# Patient Record
Sex: Female | Born: 1965
Health system: Southern US, Community
[De-identification: ages and names within clinical notes are randomized; demographics above are authoritative.]

## PROBLEM LIST (undated history)

## (undated) DIAGNOSIS — I10 Essential (primary) hypertension: Secondary | ICD-10-CM

## (undated) DIAGNOSIS — Z9109 Other allergy status, other than to drugs and biological substances: Secondary | ICD-10-CM

## (undated) DIAGNOSIS — N814 Uterovaginal prolapse, unspecified: Secondary | ICD-10-CM

## (undated) HISTORY — DX: Other allergy status, other than to drugs and biological substances: Z91.09

## (undated) HISTORY — PX: COLONOSCOPY: SHX174

---

## 1998-10-28 ENCOUNTER — Encounter: Admission: RE | Admit: 1998-10-28 | Discharge: 1998-10-28 | Payer: Self-pay | Admitting: Sports Medicine

## 1999-06-25 ENCOUNTER — Emergency Department (HOSPITAL_COMMUNITY): Admission: EM | Admit: 1999-06-25 | Discharge: 1999-06-25 | Payer: Self-pay | Admitting: Emergency Medicine

## 1999-07-26 ENCOUNTER — Encounter: Admission: RE | Admit: 1999-07-26 | Discharge: 1999-07-26 | Payer: Self-pay | Admitting: Family Medicine

## 1999-11-10 ENCOUNTER — Emergency Department (HOSPITAL_COMMUNITY): Admission: EM | Admit: 1999-11-10 | Discharge: 1999-11-10 | Payer: Self-pay | Admitting: Internal Medicine

## 1999-11-12 ENCOUNTER — Encounter: Admission: RE | Admit: 1999-11-12 | Discharge: 1999-11-12 | Payer: Self-pay | Admitting: Family Medicine

## 2000-06-22 ENCOUNTER — Encounter: Admission: RE | Admit: 2000-06-22 | Discharge: 2000-06-22 | Payer: Self-pay | Admitting: Family Medicine

## 2000-12-06 ENCOUNTER — Encounter: Admission: RE | Admit: 2000-12-06 | Discharge: 2000-12-06 | Payer: Self-pay | Admitting: Family Medicine

## 2001-06-30 ENCOUNTER — Encounter (INDEPENDENT_AMBULATORY_CARE_PROVIDER_SITE_OTHER): Payer: Self-pay | Admitting: *Deleted

## 2001-07-10 ENCOUNTER — Encounter: Admission: RE | Admit: 2001-07-10 | Discharge: 2001-07-10 | Payer: Self-pay | Admitting: Family Medicine

## 2002-12-02 ENCOUNTER — Encounter: Admission: RE | Admit: 2002-12-02 | Discharge: 2002-12-02 | Payer: Self-pay | Admitting: Family Medicine

## 2002-12-30 ENCOUNTER — Encounter: Admission: RE | Admit: 2002-12-30 | Discharge: 2002-12-30 | Payer: Self-pay | Admitting: Sports Medicine

## 2003-01-13 ENCOUNTER — Ambulatory Visit (HOSPITAL_COMMUNITY): Admission: RE | Admit: 2003-01-13 | Discharge: 2003-01-13 | Payer: Self-pay | Admitting: Family Medicine

## 2003-01-20 ENCOUNTER — Encounter: Payer: Self-pay | Admitting: *Deleted

## 2003-01-20 ENCOUNTER — Ambulatory Visit (HOSPITAL_COMMUNITY): Admission: RE | Admit: 2003-01-20 | Discharge: 2003-01-20 | Payer: Self-pay | Admitting: *Deleted

## 2003-02-03 ENCOUNTER — Encounter: Admission: RE | Admit: 2003-02-03 | Discharge: 2003-02-03 | Payer: Self-pay | Admitting: Family Medicine

## 2003-03-03 ENCOUNTER — Encounter: Admission: RE | Admit: 2003-03-03 | Discharge: 2003-03-03 | Payer: Self-pay | Admitting: Family Medicine

## 2003-04-07 ENCOUNTER — Encounter: Admission: RE | Admit: 2003-04-07 | Discharge: 2003-04-07 | Payer: Self-pay | Admitting: Family Medicine

## 2003-04-28 ENCOUNTER — Encounter: Admission: RE | Admit: 2003-04-28 | Discharge: 2003-04-28 | Payer: Self-pay | Admitting: Family Medicine

## 2003-05-15 ENCOUNTER — Encounter: Admission: RE | Admit: 2003-05-15 | Discharge: 2003-05-15 | Payer: Self-pay | Admitting: Family Medicine

## 2003-05-27 ENCOUNTER — Encounter: Admission: RE | Admit: 2003-05-27 | Discharge: 2003-05-27 | Payer: Self-pay | Admitting: Sports Medicine

## 2003-06-03 ENCOUNTER — Encounter: Admission: RE | Admit: 2003-06-03 | Discharge: 2003-06-03 | Payer: Self-pay | Admitting: Family Medicine

## 2003-06-10 ENCOUNTER — Encounter: Admission: RE | Admit: 2003-06-10 | Discharge: 2003-06-10 | Payer: Self-pay | Admitting: Family Medicine

## 2003-06-12 ENCOUNTER — Ambulatory Visit (HOSPITAL_COMMUNITY): Admission: RE | Admit: 2003-06-12 | Discharge: 2003-06-12 | Payer: Self-pay

## 2003-06-14 ENCOUNTER — Inpatient Hospital Stay (HOSPITAL_COMMUNITY): Admission: AD | Admit: 2003-06-14 | Discharge: 2003-06-16 | Payer: Self-pay | Admitting: Obstetrics and Gynecology

## 2003-06-14 ENCOUNTER — Encounter (INDEPENDENT_AMBULATORY_CARE_PROVIDER_SITE_OTHER): Payer: Self-pay | Admitting: Specialist

## 2003-08-15 ENCOUNTER — Encounter: Admission: RE | Admit: 2003-08-15 | Discharge: 2003-08-15 | Payer: Self-pay | Admitting: Sports Medicine

## 2004-08-09 ENCOUNTER — Ambulatory Visit: Payer: Self-pay | Admitting: Family Medicine

## 2005-04-08 ENCOUNTER — Ambulatory Visit: Payer: Self-pay | Admitting: Family Medicine

## 2005-07-31 ENCOUNTER — Emergency Department (HOSPITAL_COMMUNITY): Admission: AD | Admit: 2005-07-31 | Discharge: 2005-07-31 | Payer: Self-pay | Admitting: Family Medicine

## 2005-08-12 ENCOUNTER — Encounter (INDEPENDENT_AMBULATORY_CARE_PROVIDER_SITE_OTHER): Payer: Self-pay | Admitting: *Deleted

## 2005-08-12 ENCOUNTER — Ambulatory Visit: Payer: Self-pay | Admitting: Family Medicine

## 2005-12-07 ENCOUNTER — Emergency Department (HOSPITAL_COMMUNITY): Admission: EM | Admit: 2005-12-07 | Discharge: 2005-12-07 | Payer: Self-pay | Admitting: Emergency Medicine

## 2006-01-11 ENCOUNTER — Ambulatory Visit: Payer: Self-pay | Admitting: Sports Medicine

## 2006-01-13 ENCOUNTER — Ambulatory Visit: Payer: Self-pay | Admitting: Family Medicine

## 2006-02-21 ENCOUNTER — Ambulatory Visit: Payer: Self-pay | Admitting: Family Medicine

## 2006-06-29 DIAGNOSIS — J309 Allergic rhinitis, unspecified: Secondary | ICD-10-CM | POA: Insufficient documentation

## 2006-06-29 DIAGNOSIS — B351 Tinea unguium: Secondary | ICD-10-CM

## 2006-06-30 ENCOUNTER — Encounter (INDEPENDENT_AMBULATORY_CARE_PROVIDER_SITE_OTHER): Payer: Self-pay | Admitting: *Deleted

## 2006-09-18 ENCOUNTER — Ambulatory Visit: Payer: Self-pay | Admitting: Family Medicine

## 2006-09-18 ENCOUNTER — Encounter (INDEPENDENT_AMBULATORY_CARE_PROVIDER_SITE_OTHER): Payer: Self-pay | Admitting: Family Medicine

## 2006-09-18 DIAGNOSIS — I10 Essential (primary) hypertension: Secondary | ICD-10-CM | POA: Insufficient documentation

## 2006-09-18 LAB — CONVERTED CEMR LAB
CO2: 25 meq/L (ref 19–32)
Cholesterol: 98 mg/dL (ref 0–200)
Creatinine, Ser: 0.6 mg/dL (ref 0.40–1.20)
Glucose, Bld: 93 mg/dL (ref 70–99)
LDL Cholesterol: 40 mg/dL (ref 0–99)
Potassium: 4.4 meq/L (ref 3.5–5.3)
Total CHOL/HDL Ratio: 2.2

## 2006-09-19 ENCOUNTER — Encounter (INDEPENDENT_AMBULATORY_CARE_PROVIDER_SITE_OTHER): Payer: Self-pay | Admitting: Family Medicine

## 2006-10-04 ENCOUNTER — Emergency Department (HOSPITAL_COMMUNITY): Admission: EM | Admit: 2006-10-04 | Discharge: 2006-10-04 | Payer: Self-pay | Admitting: Emergency Medicine

## 2006-10-13 ENCOUNTER — Encounter: Admission: RE | Admit: 2006-10-13 | Discharge: 2006-10-13 | Payer: Self-pay | Admitting: Family Medicine

## 2006-10-13 ENCOUNTER — Encounter (INDEPENDENT_AMBULATORY_CARE_PROVIDER_SITE_OTHER): Payer: Self-pay | Admitting: Family Medicine

## 2006-11-02 ENCOUNTER — Ambulatory Visit: Payer: Self-pay | Admitting: Family Medicine

## 2006-11-02 DIAGNOSIS — R079 Chest pain, unspecified: Secondary | ICD-10-CM

## 2006-11-30 ENCOUNTER — Ambulatory Visit: Payer: Self-pay | Admitting: Family Medicine

## 2006-11-30 ENCOUNTER — Encounter: Admission: RE | Admit: 2006-11-30 | Discharge: 2006-11-30 | Payer: Self-pay | Admitting: Sports Medicine

## 2006-11-30 DIAGNOSIS — M542 Cervicalgia: Secondary | ICD-10-CM

## 2007-03-07 ENCOUNTER — Ambulatory Visit: Payer: Self-pay | Admitting: Family Medicine

## 2007-03-26 ENCOUNTER — Emergency Department (HOSPITAL_COMMUNITY): Admission: EM | Admit: 2007-03-26 | Discharge: 2007-03-26 | Payer: Self-pay | Admitting: Family Medicine

## 2007-05-07 ENCOUNTER — Ambulatory Visit: Payer: Self-pay | Admitting: Family Medicine

## 2007-05-07 DIAGNOSIS — R519 Headache, unspecified: Secondary | ICD-10-CM | POA: Insufficient documentation

## 2007-05-07 DIAGNOSIS — R1013 Epigastric pain: Secondary | ICD-10-CM | POA: Insufficient documentation

## 2007-05-07 DIAGNOSIS — M549 Dorsalgia, unspecified: Secondary | ICD-10-CM | POA: Insufficient documentation

## 2007-05-07 DIAGNOSIS — R51 Headache: Secondary | ICD-10-CM

## 2007-05-07 LAB — CONVERTED CEMR LAB: H Pylori IgG: POSITIVE

## 2007-09-23 ENCOUNTER — Emergency Department (HOSPITAL_COMMUNITY): Admission: EM | Admit: 2007-09-23 | Discharge: 2007-09-23 | Payer: Self-pay | Admitting: Family Medicine

## 2007-10-08 ENCOUNTER — Ambulatory Visit: Payer: Self-pay | Admitting: Family Medicine

## 2007-10-08 DIAGNOSIS — H538 Other visual disturbances: Secondary | ICD-10-CM

## 2007-10-22 ENCOUNTER — Ambulatory Visit (HOSPITAL_COMMUNITY): Admission: RE | Admit: 2007-10-22 | Discharge: 2007-10-22 | Payer: Self-pay | Admitting: Pediatrics

## 2007-11-16 ENCOUNTER — Encounter: Payer: Self-pay | Admitting: Family Medicine

## 2007-11-16 ENCOUNTER — Encounter (INDEPENDENT_AMBULATORY_CARE_PROVIDER_SITE_OTHER): Payer: Self-pay | Admitting: Family Medicine

## 2007-11-16 ENCOUNTER — Ambulatory Visit: Payer: Self-pay | Admitting: Family Medicine

## 2007-11-16 LAB — CONVERTED CEMR LAB
BUN: 13 mg/dL (ref 6–23)
CO2: 23 meq/L (ref 19–32)
HCT: 40.8 % (ref 36.0–46.0)
Hemoglobin: 13.2 g/dL (ref 12.0–15.0)
MCHC: 32.4 g/dL (ref 30.0–36.0)
MCV: 88.7 fL (ref 78.0–100.0)
Potassium: 3.8 meq/L (ref 3.5–5.3)
RDW: 14.2 % (ref 11.5–15.5)
Total CHOL/HDL Ratio: 2.8
Triglycerides: 80 mg/dL (ref <150)
WBC: 6.1 10*3/uL (ref 4.0–10.5)

## 2007-11-20 ENCOUNTER — Encounter: Payer: Self-pay | Admitting: Family Medicine

## 2008-02-15 ENCOUNTER — Encounter: Payer: Self-pay | Admitting: *Deleted

## 2008-02-15 ENCOUNTER — Ambulatory Visit: Payer: Self-pay | Admitting: Family Medicine

## 2008-06-05 ENCOUNTER — Telehealth: Payer: Self-pay | Admitting: Family Medicine

## 2008-06-06 ENCOUNTER — Ambulatory Visit: Payer: Self-pay | Admitting: Family Medicine

## 2008-08-15 ENCOUNTER — Telehealth: Payer: Self-pay | Admitting: Family Medicine

## 2008-08-15 ENCOUNTER — Emergency Department (HOSPITAL_COMMUNITY): Admission: EM | Admit: 2008-08-15 | Discharge: 2008-08-15 | Payer: Self-pay | Admitting: Emergency Medicine

## 2008-10-07 ENCOUNTER — Ambulatory Visit: Payer: Self-pay | Admitting: Family Medicine

## 2008-11-10 ENCOUNTER — Encounter: Admission: RE | Admit: 2008-11-10 | Discharge: 2008-11-10 | Payer: Self-pay | Admitting: Family Medicine

## 2008-11-10 ENCOUNTER — Encounter: Payer: Self-pay | Admitting: Family Medicine

## 2008-11-13 ENCOUNTER — Encounter: Payer: Self-pay | Admitting: Family Medicine

## 2008-11-18 ENCOUNTER — Encounter (INDEPENDENT_AMBULATORY_CARE_PROVIDER_SITE_OTHER): Payer: Self-pay | Admitting: Family Medicine

## 2008-11-18 ENCOUNTER — Ambulatory Visit: Payer: Self-pay | Admitting: Family Medicine

## 2008-11-18 ENCOUNTER — Encounter: Payer: Self-pay | Admitting: Family Medicine

## 2008-11-18 LAB — CONVERTED CEMR LAB
CO2: 26 meq/L (ref 19–32)
Calcium: 9.2 mg/dL (ref 8.4–10.5)
Potassium: 4.4 meq/L (ref 3.5–5.3)

## 2008-11-19 ENCOUNTER — Encounter: Payer: Self-pay | Admitting: Family Medicine

## 2008-11-21 ENCOUNTER — Encounter: Payer: Self-pay | Admitting: Family Medicine

## 2008-12-15 ENCOUNTER — Telehealth: Payer: Self-pay | Admitting: Family Medicine

## 2008-12-16 ENCOUNTER — Ambulatory Visit: Payer: Self-pay | Admitting: Family Medicine

## 2008-12-16 DIAGNOSIS — Z780 Asymptomatic menopausal state: Secondary | ICD-10-CM | POA: Insufficient documentation

## 2009-01-06 ENCOUNTER — Ambulatory Visit: Payer: Self-pay | Admitting: Family Medicine

## 2009-01-06 ENCOUNTER — Encounter: Payer: Self-pay | Admitting: Family Medicine

## 2009-01-06 LAB — CONVERTED CEMR LAB
Hemoglobin: 12.3 g/dL (ref 12.0–15.0)
RDW: 14.1 % (ref 11.5–15.5)
TSH: 0.996 microintl units/mL (ref 0.350–4.500)
WBC: 5.2 10*3/uL (ref 4.0–10.5)

## 2009-01-07 ENCOUNTER — Encounter: Payer: Self-pay | Admitting: Family Medicine

## 2009-01-14 ENCOUNTER — Telehealth: Payer: Self-pay | Admitting: *Deleted

## 2009-05-04 ENCOUNTER — Ambulatory Visit: Payer: Self-pay | Admitting: Family Medicine

## 2009-05-15 ENCOUNTER — Ambulatory Visit: Payer: Self-pay | Admitting: Family Medicine

## 2009-05-15 ENCOUNTER — Encounter: Payer: Self-pay | Admitting: Family Medicine

## 2009-05-15 DIAGNOSIS — N926 Irregular menstruation, unspecified: Secondary | ICD-10-CM

## 2009-05-16 ENCOUNTER — Encounter: Payer: Self-pay | Admitting: Family Medicine

## 2009-05-16 LAB — CONVERTED CEMR LAB
AST: 15 units/L (ref 0–37)
BUN: 11 mg/dL (ref 6–23)
Creatinine, Ser: 1.02 mg/dL (ref 0.40–1.20)
Glucose, Bld: 102 mg/dL — ABNORMAL HIGH (ref 70–99)
HCT: 39 % (ref 36.0–46.0)
Hemoglobin: 12.9 g/dL (ref 12.0–15.0)
MCV: 86.3 fL (ref 78.0–100.0)
Platelets: 229 10*3/uL (ref 150–400)
Sodium: 139 meq/L (ref 135–145)
Total Bilirubin: 0.4 mg/dL (ref 0.3–1.2)
Total Protein: 7.4 g/dL (ref 6.0–8.3)
WBC: 5 10*3/uL (ref 4.0–10.5)

## 2009-05-18 ENCOUNTER — Encounter: Admission: RE | Admit: 2009-05-18 | Discharge: 2009-05-18 | Payer: Self-pay | Admitting: Family Medicine

## 2009-05-18 ENCOUNTER — Telehealth: Payer: Self-pay | Admitting: Family Medicine

## 2009-11-20 ENCOUNTER — Encounter: Admission: RE | Admit: 2009-11-20 | Discharge: 2009-11-20 | Payer: Self-pay | Admitting: Family Medicine

## 2009-11-20 ENCOUNTER — Ambulatory Visit: Payer: Self-pay | Admitting: Family Medicine

## 2009-11-20 ENCOUNTER — Encounter: Payer: Self-pay | Admitting: Family Medicine

## 2009-11-20 LAB — CONVERTED CEMR LAB
GC Probe Amp, Genital: NEGATIVE
Whiff Test: NEGATIVE

## 2009-11-22 ENCOUNTER — Encounter: Payer: Self-pay | Admitting: Family Medicine

## 2010-01-21 ENCOUNTER — Ambulatory Visit: Payer: Self-pay | Admitting: Family Medicine

## 2010-01-21 ENCOUNTER — Telehealth: Payer: Self-pay | Admitting: Family Medicine

## 2010-01-21 ENCOUNTER — Encounter: Payer: Self-pay | Admitting: Family Medicine

## 2010-01-21 DIAGNOSIS — K219 Gastro-esophageal reflux disease without esophagitis: Secondary | ICD-10-CM | POA: Insufficient documentation

## 2010-05-23 ENCOUNTER — Encounter: Payer: Self-pay | Admitting: Family Medicine

## 2010-06-03 NOTE — Letter (Signed)
Summary: Results Follow-up Letter  [facility]  [address]   [place], [place] [address]   Phone: [phone]  Fax: [phone]    11/22/2009  [address] [place], [place]  [address]  Dear Ms. Sheerin,   The following are the results of your recent test(s):  Your tests for gonorrhea, chlamydia, HIV, syphillis, and trichomonas are negative.  Please remember to get your yearly mammogram.  Nice to meet you!  Sincerely,  Delbert Harness MD [facility]           Appended Document: Results Follow-up Letter mailed

## 2010-06-03 NOTE — Assessment & Plan Note (Signed)
Summary: irregular periods continue/ls   Vital Signs:  Patient profile:   45 year old female Height:      66.25 inches Weight:      202.1 pounds BMI:     32.49 Temp:     98.5 degrees F Pulse rate:   83 / minute BP sitting:   132 / 82  (left arm) Cuff size:   regular  Vitals Entered By: Gladstone Pih (May 15, 2009 8:33 AM) CC: C/O irreg cycles Pain Assessment Patient in pain? no        CC:  C/O irreg cycles.  History of Present Illness: 1.  menses--still having irregular cycles.  Initially the problem was having no period July and August .  At that time, we discussed options to regulate her period.  She actually opted for a depo shot.  Depo shot in August.  After that she started having longer periods with heavy bleeding: long period with heavy bleeding (5-6 pad/day) and clots in September 10/15-11/6:  heavy 12/9-1/2; not as heavy  What worries her is that she used to have very regular periods.  She is worried that she might have something wrong, such as cancer or fibroids.    spent >50% of visit in face-to-face counseling with patient    Habits & Providers  Alcohol-Tobacco-Diet     Tobacco Status: never  Current Medications (verified): 1)  Flonase 50 Mcg/act Susp (Fluticasone Propionate) .... Spray 2 Spray Into Both Nostrils Once A Day 2)  Hydrochlorothiazide 25 Mg Tabs (Hydrochlorothiazide) .... Take 1 Tablet By Mouth Once A Day 3)  Zyrtec Allergy 10 Mg Tabs (Cetirizine Hcl) .... Take 1 Tablet By Mouth At Bedtime 4)  Ibuprofen 600 Mg  Tabs (Ibuprofen) .Marland Kitchen.. 1 Tablet Po Every 4-6 Hours As Needed Pain; Take With Food 5)  Fluconazole 200 Mg Tabs (Fluconazole) .Marland Kitchen.. 1 Tab By Mouth Weekly For Feet  Allergies: No Known Drug Allergies  Review of Systems       The patient complains of headaches.  The patient denies anorexia, weight loss, and weight gain.         also complains of fatigue  Physical Exam  General:  Well-developed,overweight,in no acute  distress; alert,appropriate and cooperative throughout examination Genitalia:  defered.  normal exam at her pap when we first discussed this issue Psych:  anxious about her periods Additional Exam:  vital signs reviewed    Impression & Recommendations:  Problem # 1:  IRREGULAR MENSES (ICD-626.4) Assessment Deteriorated I still think this is perimenopause.  However, pt is quite worried that there is something wrong.  she is hesitant to try OCPs before further testing.  I don't think that she will be satisfied until further testing is done.  recheck CBC.  Check CMET.  order transvag u/s.  follow up after u/s done.   Orders: CBC-[facility] ([address]) Comp Met-[facility] ([address]11) Ultrasound (Ultrasound) [facility]- Est  Level 4 ([address])  Complete Medication List: 1)  Flonase 50 Mcg/act Susp (Fluticasone propionate) .... Spray 2 spray into both nostrils once a day 2)  Hydrochlorothiazide 25 Mg Tabs (Hydrochlorothiazide) .... Take 1 tablet by mouth once a day 3)  Zyrtec Allergy 10 Mg Tabs (Cetirizine hcl) .... Take 1 tablet by mouth at bedtime 4)  Ibuprofen 600 Mg Tabs (Ibuprofen) .Marland Kitchen.. 1 tablet po every 4-6 hours as needed pain; take with food 5)  Fluconazole 200 Mg Tabs (Fluconazole) .Marland Kitchen.. 1 tab by mouth weekly for feet  Patient Instructions: 1)  It was nice to see  you today. 2)  We will schedule your ultrasound. 3)  I will call you if your labs are not normal.  Otherwise, I will send you a letter. 4)  Please schedule a follow-up appointment after your ultrasound. 5)  Call me if you have any problems in the meantime

## 2010-06-03 NOTE — Assessment & Plan Note (Signed)
Summary: flu shot,tcb  Nurse Visit   Vital Signs:  Patient profile:   45 year old female Temp:     98.9 degrees F  Vitals Entered By: Theresia Lo RN (May 04, 2009 3:56 PM)  Allergies: No Known Drug Allergies  Immunizations Administered:  Influenza Vaccine # 1:    Vaccine Type: Fluvax Non-MCR    Site: left deltoid    Mfr: GlaxoSmithKline    Dose: 0.5 ml    Route: IM    Given by: Theresia Lo RN    Exp. Date: 10/29/2009    Lot #: [id number]    VIS given: 12/09/2008  Flu Vaccine Consent Questions:    Do you have a history of severe allergic reactions to this vaccine? no    Any prior history of allergic reactions to egg and/or gelatin? no    Do you have a sensitivity to the preservative Thimersol? no    Do you have a past history of Guillan-Barre Syndrome? no    Do you currently have an acute febrile illness? no    Have you ever had a severe reaction to latex? no    Vaccine information given and explained to patient? yes    Are you currently pregnant? no  Orders Added: 1)  Influenza Vaccine NON MCR [00028] 2)  Admin 1st Vaccine [90471]   Orders Added: 1)  Influenza Vaccine NON MCR [00028] 2)  Admin 1st Vaccine [90471]      Vital Signs:  Patient profile:   45 year old female Temp:     98.9 degrees F  Vitals Entered By: Theresia Lo RN (May 04, 2009 3:56 PM)

## 2010-06-03 NOTE — Assessment & Plan Note (Signed)
Summary: HA x 1 wk/[place]   Vital Signs:  Patient profile:   45 year old female Weight:      202 pounds Temp:     98.8 degrees F oral Pulse rate:   87 / minute BP sitting:   122 / 87  (right arm)  Vitals Entered By: Arlyss Repress CMA, (January 21, 2010 1:57 PM) CC: HA, Abdominal Pain   Primary Care Provider:  Delbert Harness MD  CC:  HA and Abdominal Pain.  History of Present Illness: 45 y/o F with HTN presents with HA x 1 week.  Pt is prone to headache, but previous HAs has always resolved.  This time HA has not resolved with ibuprofen or tylenol.  Took advil x2 tabs, with some relief.  HEADACHE  Onset: 1 week Location: starts from left side of neck and goes up to whole head Description: squeezing Last time she had HA similar to this was 09/2008, which was relieved by ibuprofen 600mg   Symptoms Nausea/vomiting: nausea started last night and this morning  Photophobia: no Phonophobia: no Tearing of eyes: no Sinus pain/pressure: no. she has allergies and this does not feel like that.  She has not taken zyrtec  Relation to menstrual cycle: no  Red Flags Fever: yes "body always feels hot" Neck pain/stiffness: no Vision/speech difficulty: no Focal weakness or numbness: no Altered mental status: no Trauma: no Worse in am: no Anticoagulant use: no Immunocompromise: no Travel: no  Family history of Migraine: no   REFLUX: Pt has been having reflux for years.  She has been taking TUMS, which worked at first.  Now [employer] not effective.  endorses feeling "burning" sensation substernal.  NO chest pain. Pt eating very spicy foods.  no weight loss.  no bloody stoools.       Dyspepsia History:      There is a prior history of GERD.  She notes that it has been less than 12 months since the last episode of GERD and that she has never had an upper endoscopy or GI consultation.  The patient does not have a prior history of documented ulcer disease.  The dominant symptom is not  heartburn or acid reflux.  An H-2 blocker medication is not currently being taken.     Allergies: No Known Drug Allergies  Past History:  Past Medical History: Last updated: 11/18/2008 h/o positive ppd, no sxs (immnized?) allergic rhinitis nail fungus htn  Past Surgical History: Last updated: 11/16/2007 none  Family History: Last updated: 06/29/2006 B, S:  Healthy, F:  Unknown (died while pt was very young), M:  HTN (died while pt was very young), No know CAD, CA, hyperchol, asthma, DM  Social History: Last updated: 11/18/2008 Moved from [place] 17 years ago, married with 3 children, 2 years college, works at [employer], occasional  EtOH no tob, drugs. no regular exercise Never Smoked  Risk Factors: Smoking Status: never (05/15/2009)  Review of Systems       per hpi   Physical Exam  General:  Well-developed,well-nourished,in no acute distress; alert,appropriate and cooperative throughout examination. vitals reviewed.  Eyes:  No corneal or conjunctival inflammation noted. EOMI. Perrla. Funduscopic exam benign, without hemorrhages, exudates or papilledema. Vision grossly normal. Mouth:  Oral mucosa and oropharynx without lesions or exudates.  Neck:  supple, full ROM, no masses, no cervical lymphadenopathy, and no neck tenderness.   Lungs:  Normal respiratory effort, chest expands symmetrically. Lungs are clear to auscultation, no crackles or wheezes. Heart:  Normal rate  and regular rhythm. S1 and S2 normal without gallop, murmur, click, rub or other extra sounds. Neurologic:  No cranial nerve deficits noted. Station and gait are normal. Plantar reflexes are down-going bilaterally. DTRs are symmetrical throughout. Sensory, motor and coordinative functions appear intact.strength normal in all extremities, sensation intact to light touch, gait normal, DTRs symmetrical and normal, finger-to-nose normal, and heel-to-shin normal.     Impression & Recommendations:  Problem # 1:   HEADACHE (ICD-784.0) Assessment New HA etiology unclear.  2/2 allergies?  Pt also having symptoms of seasonal allergies (rhinorrhea, scratchy throat) but only taking flonase.  She has not been taking Zyrtec.   Advised resuming Zyrtec.  Will Rx ibuprofen 600mg  as it has worked in past.  RTC in 1 wk if not better.  No red flag symptoms.  Neuro exam wnl.   Her updated medication list for this problem includes:    Ibuprofen 600 Mg Tabs (Ibuprofen) .Marland Kitchen... 1 tablet po every 4-6 hours as needed pain; take with food  Orders: [facility]- Est  Level 4 ([address])  Problem # 2:  GERD (ICD-530.81) Assessment: Deteriorated GERD present for yrs but not refractory to TUMS.  Will try Omeprazole.  Discussed GERD lifestyle changes (avoid spicy foods, elevated head of bed, do not eat before bed).   Her updated medication list for this problem includes:    Omeprazole 20 Mg Cpdr (Omeprazole) .Marland Kitchen... 1 tab by mouth daily for reflux  Orders: [facility]- Est  Level 4 ([address]214)  Complete Medication List: 1)  Flonase 50 Mcg/act Susp (Fluticasone propionate) .... Spray 2 spray into both nostrils once a day 2)  Hydrochlorothiazide 25 Mg Tabs (Hydrochlorothiazide) .... Take 1 tablet by mouth once a day 3)  Zyrtec Allergy 10 Mg Tabs (Cetirizine hcl) .... Take 1 tablet by mouth at bedtime 4)  Ibuprofen 600 Mg Tabs (Ibuprofen) .Marland Kitchen.. 1 tablet po every 4-6 hours as needed pain; take with food 5)  Fluconazole 200 Mg Tabs (Fluconazole) .Marland Kitchen.. 1 tab by mouth weekly for feet 6)  Lessina-28 0.1-20 Mg-mcg Tabs (Levonorgestrel-ethinyl estrad) .... Take one tablet a the same time daily.  dispense one month 7)  Omeprazole 20 Mg Cpdr (Omeprazole) .Marland Kitchen.. 1 tab by mouth daily for reflux  Patient Instructions: 1)  Please schedule a follow-up appointment in 1 week if Headache still present. 2)  For Headache: Ibuprofen 600mg  every 4-6 hours as needed for  headache. 3)  Try Zyrtec every morning allergies. 4)  Try Omeprazole for heartburn.   Prescriptions: IBUPROFEN 600 MG  TABS (IBUPROFEN) 1 tablet po every 4-6 hours as needed pain; take with food  #90 Tablet x 0   Entered and Authorized by:   Angeline Slim MD   Signed by:   Angeline Slim MD on 01/21/2010   Method used:   [facility]        [facility] [address] [phone]* (retail)       [address]       [place], [place]  [address]       Ph: [phone] or [phone]       Fax: [phone]   RxID:   [id number] ZYRTEC ALLERGY 10 MG TABS (CETIRIZINE HCL) Take 1 tablet by mouth at bedtime  #30 x 12   Entered and Authorized by:   Angeline Slim MD   Signed by:   Angeline Slim MD on 01/21/2010   Method used:   [facility]  [facility] [address] [phone]* (retail)       [address]       [place], [place]  [address]       Ph: [phone] or [phone]       Fax: [phone]   RxID:   [id number] OMEPRAZOLE 20 MG CPDR (OMEPRAZOLE) 1 tab by mouth daily for reflux  #30 x 3   Entered and Authorized by:   Angeline Slim MD   Signed by:   Angeline Slim MD on 01/21/2010   Method used:   [facility]ronically to        [facility] [address] [phone]* (retail)       [address]       [place], [place]  [address]       Ph: [phone] or [phone]       Fax: [phone]   RxID:   [id number]

## 2010-06-03 NOTE — Progress Notes (Signed)
Summary: triage  Phone Note Call from Patient Call back at Work Phone [phone]   Caller: Patient Summary of Call: Pt has headache.  Asking to be seen today. Initial call taken by: Clydell Hakim,  January 21, 2010 9:46 AM  Follow-up for Phone Call        HA x 1 wk. has used tylenol & ibu. interrupting sleep. feels feverish. she is at work & has to find someone to cover for her. will be here at 2:30 to see Dr. Janalyn Harder Follow-up by: Golden Circle RN,  January 21, 2010 9:48 AM

## 2010-06-03 NOTE — Progress Notes (Signed)
Summary: phn msg  Phone Note Outgoing Call   Call placed by: Asher Muir MD,  May 18, 2009 1:54 PM Summary of Call: Called and left mssg to call back about u/s results Initial call taken by: Asher Muir MD,  May 18, 2009 1:54 PM  Follow-up for Phone Call        Pt calling back and will be at home. Follow-up by: Clydell Hakim,  May 18, 2009 2:06 PM  Additional Follow-up for Phone Call Additional follow up Details #1::        called and explained u/s results.  normal stripe and one small fibroid.  she decided to try ocps to regulate her cycle for the next 3 months.  she will make f/u appt in 3 months and we will decide if further workup needed at that time. Additional Follow-up by: Asher Muir MD,  May 18, 2009 5:42 PM    New/Updated Medications: SPRINTEC 28 0.25-35 MG-MCG TABS (NORGESTIMATE-ETH ESTRADIOL) 1 tab by mouth daily; dispense generic.  dispense 3 packs Prescriptions: SPRINTEC 28 0.25-35 MG-MCG TABS (NORGESTIMATE-ETH ESTRADIOL) 1 tab by mouth daily; dispense generic.  dispense 3 packs  #1 x 3   Entered and Authorized by:   Asher Muir MD   Signed by:   Asher Muir MD on 05/18/2009   Method used:   Electronically to        [facility] [phone]* (retail)       [address]       [place], [place]  [address]       Ph: [phone]       Fax: [phone]   RxID:   [id number]

## 2010-06-03 NOTE — Letter (Signed)
Summary: Generic Letter  [facility]  [address]   [place], [place] [address]   Phone: [phone]  Fax: [phone]    05/16/2009  Veronica Coleman [address] [place], [place]  [address]  Dear [name],    I just wanted to let you know that your lab results were normal.  Please call me if you have any questions or concerns.          Sincerely,   Asher Muir MD  Appended Document: Generic Letter mailed letter to pt

## 2010-06-03 NOTE — Letter (Signed)
Summary: Out of Work  [facility]  [address]   [place], [place] [address]   Phone: [phone]  Fax: [phone]    January 21, 2010   Employee:  IMO CUMBIE [employer]    To Whom It May Concern:   For Medical reasons, please excuse the above named employee from work for the following dates:  Start:   Sept 22, 2011.  End:   May return to work on Monday, Sept 26, 2011.  Ms. Mette is already off on Friday, Sept 23, 2011.  If you need additional information, please feel free to contact our office.         Sincerely,    Cat Ta MD

## 2010-06-03 NOTE — Assessment & Plan Note (Signed)
Summary: cpe,df   Vital Signs:  Patient profile:   45 year old female LMP:     11/10/2009 Height:      66.25 inches Weight:      204.6 pounds Temp:     98.4 degrees F oral Pulse rate:   96 / minute Pulse rhythm:   regular BP sitting:   132 / 86  Vitals Entered By: Loralee Pacas CMA (November 20, 2009 9:18 AM) CC: cpp LMP (date): 11/10/2009     Enter LMP: 11/10/2009 Last PAP Result NEGATIVE FOR INTRAEPITHELIAL LESIONS OR MALIGNANCY.   Primary Care Provider:  Delbert Harness MD  CC:  cpp.  History of Present Illness: 45 yo here for anunal gynecological exam  LMP: last month  Contraception: was on sprintec and had regular periods but discontinued this month due to headaches , "weird things" Regular Menses: yes while on pill Hx of Anemia:no FHx of Breast, Uterine, Cervical or Ovarian Cancer:  No Last Pap:  2010 Hx of Abnormal Pap:  No Desires STD testing: yes Last Mammogram: has one scheduled for today Abnormalities on Self-exam:  No Hx of Abnormal Mammogram:  No  HYPERTENSION Meds: Taking and tolerating? hctx 25, yes Home BP's: no Chest Pain: no Dyspnea: no   Current Medications (verified): 1)  Flonase 50 Mcg/act Susp (Fluticasone Propionate) .... Spray 2 Spray Into Both Nostrils Once A Day 2)  Hydrochlorothiazide 25 Mg Tabs (Hydrochlorothiazide) .... Take 1 Tablet By Mouth Once A Day 3)  Zyrtec Allergy 10 Mg Tabs (Cetirizine Hcl) .... Take 1 Tablet By Mouth At Bedtime 4)  Ibuprofen 600 Mg  Tabs (Ibuprofen) .Marland Kitchen.. 1 Tablet Po Every 4-6 Hours As Needed Pain; Take With Food 5)  Fluconazole 200 Mg Tabs (Fluconazole) .Marland Kitchen.. 1 Tab By Mouth Weekly For Feet 6)  Lessina-28 0.1-20 Mg-Mcg Tabs (Levonorgestrel-Ethinyl Estrad) .... Take One Tablet A The Same Time Daily.  Dispense One Month  Allergies: No Known Drug Allergies PMH-FH-SH reviewed-no changes except otherwise noted  Review of Systems      See HPI  Physical Exam  General:  Well-developed,overweight,in no acute  distress; alert,appropriate and cooperative throughout examination Neck:  No deformities, masses, or tenderness noted. Breasts:  No mass, nodules, thickening, tenderness, bulging, retraction, inflamation, nipple discharge or skin changes noted.   Lungs:  Normal respiratory effort, chest expands symmetrically. Lungs are clear to auscultation, no crackles or wheezes. Heart:  Normal rate and regular rhythm. S1 and S2 normal without gallop, murmur, click, rub or other extra sounds. Abdomen:  Bowel sounds positive,abdomen soft and non-tender without masses, organomegaly or hernias noted. Genitalia:  Pelvic Exam:        External: normal female genitalia without lesions or masses        Vagina: normal without lesions or masses        Cervix: normal without lesions or masses        Adnexa: normal bimanual exam without masses or fullness        Uterus: normal by palpation Wet prep, GC/Chlamydia collected   Impression & Recommendations:  Problem # 1:  Gynecological examination-routine (ICD-V72.31) will restart oral hormonal birth control with low dose estrogen given she may possibly be perimenopausal in near future- still having regular menses.  Due for mammogram.  Patient desires STD screening today.  Problem # 2:  HYPERTENSION, BENIGN ESSENTIAL (ICD-401.1)  Well controlled.  Continue current regimen.  Her updated medication list for this problem includes:    Hydrochlorothiazide 25 Mg Tabs (Hydrochlorothiazide) .Marland Kitchen... Take 1  tablet by mouth once a day  Orders: [facility] - Est  40-64 yrs ([address])  Complete Medication List: 1)  Flonase 50 Mcg/act Susp (Fluticasone propionate) .... Spray 2 spray into both nostrils once a day 2)  Hydrochlorothiazide 25 Mg Tabs (Hydrochlorothiazide) .... Take 1 tablet by mouth once a day 3)  Zyrtec Allergy 10 Mg Tabs (Cetirizine hcl) .... Take 1 tablet by mouth at bedtime 4)  Ibuprofen 600 Mg Tabs (Ibuprofen) .Marland Kitchen.. 1 tablet po every 4-6 hours as needed pain; take with  food 5)  Fluconazole 200 Mg Tabs (Fluconazole) .Marland Kitchen.. 1 tab by mouth weekly for feet 6)  Lessina-28 0.1-20 Mg-mcg Tabs (Levonorgestrel-ethinyl estrad) .... Take one tablet a the same time daily.  dispense one month  Other Orders: GC/Chlamydia-[facility] (87591/87491) [facility] [phone]) RPR-[facility] [phone]) HIV-[facility] [phone])  Patient Instructions: 1)  Will call in new birth control to your pharmacy today. Let me know if you need another birth control option.   2)  Work on healthy weight and exercise- see nutrition handout 3)  make follow-up appt  in 6 months or sooner if needed Prescriptions: LESSINA-28 0.1-20 MG-MCG TABS (LEVONORGESTREL-ETHINYL ESTRAD) take one tablet a the same time daily.  Dispense one month  #1 x 11   Entered and Authorized by:   Delbert Harness MD   Signed by:   Delbert Harness MD on 11/22/2009   Method used:   [facility] to        [facility] [address] [phone]* (retail)       [address]       [place], [place]  [address]       Ph: [phone] or [phone]       Fax: [phone]   RxID:   [id number]   Laboratory Results  Date/Time Received: November 20, 2009 9:57 AM  Date/Time Reported: November 20, 2009 10:03 AM   Wet [place] Source: vag WBC/hpf: 1-5 Bacteria/hpf: 1+  Rods Clue cells/hpf: none  Negative whiff Yeast/hpf: none Trichomonas/hpf: none Comments: ...............test performed by......Marland KitchenBonnie A. [place], MLS (ASCP)cm     Prevention & Chronic Care Immunizations   Influenza vaccine: Fluvax Non-MCR  (05/04/2009)   Influenza vaccine due: 03/06/2008    Tetanus booster: 10/01/1998: Done.   Tetanus booster due: 09/30/2008    Pneumococcal vaccine: Not documented  Other Screening   Pap smear: NEGATIVE FOR INTRAEPITHELIAL LESIONS OR MALIGNANCY.  (11/18/2008)   Pap smear action/deferral: PAP smear done  (11/16/2007)   Pap smear due: 11/19/2010    Mammogram: normal  (10/23/2007)   Mammogram due: 10/22/2008   Smoking  status: never  (05/15/2009)  Lipids   Total Cholesterol: 138  (11/16/2007)   LDL: 72  (11/16/2007)   LDL Direct: Not documented   HDL: 50  (11/16/2007)   Triglycerides: 80  (11/16/2007)  Hypertension   Last Blood Pressure: 132 / 86  (11/20/2009)   Serum creatinine: 1.02  (05/15/2009)   Serum potassium 3.9  (05/15/2009)    Hypertension flowsheet reviewed?: Yes   Progress toward BP goal: At goal  Self-Management Support :    Hypertension self-management support: Not documented

## 2010-08-13 ENCOUNTER — Other Ambulatory Visit: Payer: Self-pay | Admitting: Family Medicine

## 2010-08-13 MED ORDER — HYDROCHLOROTHIAZIDE 25 MG PO TABS: 25.0000 mg | ORAL_TABLET | Freq: Every day | ORAL | Status: DC

## 2010-09-09 ENCOUNTER — Ambulatory Visit (INDEPENDENT_AMBULATORY_CARE_PROVIDER_SITE_OTHER): Payer: BC Managed Care – PPO | Admitting: Family Medicine

## 2010-09-09 VITALS — BP 124/84 | HR 78 | Temp 98.2°F | Ht 67.0 in | Wt 201.3 lb

## 2010-09-09 DIAGNOSIS — M549 Dorsalgia, unspecified: Secondary | ICD-10-CM

## 2010-09-09 MED ORDER — PROMETHAZINE HCL 25 MG/ML IJ SOLN
25.0000 mg | Freq: Once | INTRAMUSCULAR | Status: AC
  Administered 2010-09-09: 25 mg via INTRAMUSCULAR

## 2010-09-09 MED ORDER — KETOROLAC TROMETHAMINE 60 MG/2ML IM SOLN
60.0000 mg | Freq: Once | INTRAMUSCULAR | Status: AC
  Administered 2010-09-09: 60 mg via INTRAMUSCULAR

## 2010-09-09 MED ORDER — CYCLOBENZAPRINE HCL 5 MG PO TABS: 5.0000 mg | ORAL_TABLET | Freq: Three times a day (TID) | ORAL | Status: AC | PRN

## 2010-09-09 MED ORDER — IBUPROFEN 600 MG PO TABS: 600.0000 mg | ORAL_TABLET | Freq: Four times a day (QID) | ORAL | Status: DC | PRN

## 2010-09-09 NOTE — Progress Notes (Signed)
  Subjective:    Patient ID: Veronica Coleman, female    DOB: [birth year], 45 y.o.   MRN: [id number]  HPI This is a 45 year old female who presents with CC: low back pain.  Patient says this has been going on intermittently for 3-4 months, but was significantly pain this morning when she woke up.  Pain is located laterally to lumbar spine - specifically L4 to L5.  Pain is lateral to lumbar spine and radiates from side to side.  No chest pain or abdominal pain.  Took Bayer Aspirin today which somewhat relieved pain.  Back pain is associated with headache, numbness/tingling of L lower extremity and sometimes L upper extremity.  No loss of bladder control.  No constipation or diarrhea.  Denies dysuria, foul odor, or hematuria.  Denies any recent injury or trauma.  However, patient works in the [employer] and which can be strenous at times.  Denies any fever, change in weight, chills, decreased appetite.  Review of Systems Per HPI    Objective:   Physical Exam  Constitutional: She appears well-developed and well-nourished.  Neck: Full passive range of motion without pain. Neck supple. No spinous process tenderness and no muscular tenderness present.  Pulmonary/Chest: Effort normal and breath sounds normal. No respiratory distress.  Musculoskeletal:       Lumbar back: She exhibits tenderness, bony tenderness, pain and spasm. She exhibits normal range of motion, no swelling, no edema and no deformity.       Left upper leg: She exhibits tenderness. She exhibits no swelling and no deformity.       Normal gait.  Normal flexion and extension.  Mild pain with hip rotation.  Positive straight leg raise.   Mild tenderness at lumbar spine, no asymmetry, limited range of motion, muscle/tissue tightness.          Assessment & Plan:

## 2010-09-09 NOTE — Assessment & Plan Note (Signed)
Patient's symptoms of low back pain, numbness tingling of L leg, and positive straight leg raise test make sciatica a possible diagnosis.  However, this could be low back pain secondary to muscle strain/overuse since patient works at a [employer].  Will treat symptoms with Motrin 600mg  prn and Flexeril 5mg  prn for 30 days.  For current 8/10 pain, will give Toradol IM and Phenergan IM.  Patient aware of possible drowsiness.  Husband will drive patient home.  Advised patient to return to clinic if she experiences GU symptoms - dysuria, foul odor, hematuria, loss of bladder control.  Advised to return to clinic if she has SOB, difficulty walking, or worsening numbness/tingling.  At that time, we may consider imaging to rule out herniated disc.  Patient to follow with PCP in 4-6 weeks.  Patient agreed and understood plan.

## 2010-09-09 NOTE — Patient Instructions (Addendum)
It was great to meet you today. Please take Motrin and Flexeril as needed for low back pain. If pain does not improve in 4 weeks, please return to clinic for further evaluation. Consider applying heat or ice pads to affected areas and avoid sitting or laying down in one position for a long time. If pain becomes more severe and is associated with numbness/tingling of extremities, difficulty walking/unsteady gait, or pain with urination, fever, etc., please return to clinic. Thank you.  Lumbar Radiculopathy, Sciatica Sciatica is a weakness and/or changes in sensation (tingling, jolts, hot and cold, numbness) along the path the sciatic nerve travels. Irritation or damage to lumbar nerve roots is often also referred to as lumbar radiculopathy.  Lumbar radiculopathy (Sciatica) is the most common form of this problem. Radiculopathy can occur in any of the nerves coming out of the spinal cord. The problems caused depend on which nerves are involved. The sciatic nerve is the large nerve supplying the branches of nerves going from the hip to the toes. It often causes a numbness or weakness in the skin and/or muscles that the sciatic nerve serves. It also may cause symptoms (problems) of pain, burning, tingling, or electric shock-like feelings in the path of this nerve. This usually comes from injury to the fibers that make up the sciatic nerve. Some of these symptoms are low back pain and/or unpleasant feelings in the following areas:  From the mid-buttock down the back of the leg to the back of the knee.   And/or the outside of the calf and top of the foot.   And/or behind the inner ankle to the sole of the foot.  CAUSES  Herniated or slipped disc. Discs are the little cushions between the bones in the back.   Pressure by the piriformis muscle in the buttock on the sciatic nerve (Piriformis Syndrome).   Misalignment of the bones in the lower back and buttocks (Sacroiliac Joint Derangement).    Narrowing of the spinal canal that puts pressure on or pinches the fibers that make up the sciatic nerve.   A slipped vertebra that is out of line with those above or beneath it.   Abnormality of the nervous system itself so that nerve fibers do not transmit signals properly, especially to feet and calves (neuropathy).   Tumor (this is rare).  Your caregiver can usually determine the cause of your sciatica and begin the treatment most likely to help you. TREATMENT Taking over-the-counter painkillers, physical therapy, rest, exercise, spinal manipulation, and injections of anesthetics and/or steroids may be used. Surgery, acupuncture, and Yoga can also be effective. Mind over matter techniques, mental imagery, and changing factors such as your bed, chair, desk height, posture, and activities are other treatments that may be helpful. You and your caregiver can help determine what is best for you. With proper diagnosis, the cause of most sciatica can be identified and removed. Communication and cooperation between your caregiver and you is essential. If you are not successful immediately, do not be discouraged. With time, a proper treatment can be found that will make you comfortable. HOME CARE INSTRUCTIONS  If the pain is coming from a problem in the back, applying ice to that area for 60 minutes, 3 times per day while awake, may be helpful. Put the ice in a plastic bag. Place a towel between the bag of ice and your skin.   You may exercise or perform your usual activities if these do not aggravate your pain, or as  suggested by your caregiver.   Only take over-the-counter or prescription medicines for pain, discomfort, or fever as directed by your caregiver.   If your caregiver has given you a follow-up appointment, it is very important to keep that appointment. Not keeping the appointment could result in a chronic or permanent injury, pain, and disability. If there is any problem keeping the  appointment, you must call back to this facility for assistance.  SEEK IMMEDIATE MEDICAL CARE IF:  You experience loss of control of bowel or bladder.   You have increasing weakness in the trunk, buttocks, or legs.   There is numbness in any areas from the hip down to the toes.   You have difficulty walking or keeping your balance.   You have any of the above, with fever or forceful vomiting.  Document Released: 04/12/2001 Document Re-Released: 05/10/2009 [facility] Patient Information 2011 [place], [place].

## 2010-09-09 NOTE — Progress Notes (Signed)
Addended by: Loralee Pacas on: 09/09/2010 04:38 PM   Modules accepted: Orders

## 2010-10-15 ENCOUNTER — Other Ambulatory Visit: Payer: Self-pay | Admitting: Family Medicine

## 2010-10-15 DIAGNOSIS — Z1231 Encounter for screening mammogram for malignant neoplasm of breast: Secondary | ICD-10-CM

## 2010-11-22 ENCOUNTER — Ambulatory Visit
Admission: RE | Admit: 2010-11-22 | Discharge: 2010-11-22 | Disposition: A | Discharge status: Home / Self Care | Payer: Commercial Indemnity | Source: Ambulatory Visit | Attending: Family Medicine | Admitting: Family Medicine

## 2010-11-22 ENCOUNTER — Ambulatory Visit: Payer: BC Managed Care – PPO

## 2010-11-22 DIAGNOSIS — Z1231 Encounter for screening mammogram for malignant neoplasm of breast: Secondary | ICD-10-CM

## 2010-11-25 ENCOUNTER — Encounter (INDEPENDENT_AMBULATORY_CARE_PROVIDER_SITE_OTHER): Payer: Commercial Indemnity | Admitting: Family Medicine

## 2010-12-03 ENCOUNTER — Other Ambulatory Visit (HOSPITAL_COMMUNITY)
Admission: RE | Admit: 2010-12-03 | Discharge: 2010-12-03 | Disposition: A | Discharge status: Home / Self Care | Payer: Commercial Indemnity | Source: Ambulatory Visit | Attending: Family Medicine | Admitting: Family Medicine

## 2010-12-03 ENCOUNTER — Ambulatory Visit (INDEPENDENT_AMBULATORY_CARE_PROVIDER_SITE_OTHER): Payer: Commercial Indemnity | Admitting: Family Medicine

## 2010-12-03 ENCOUNTER — Encounter: Payer: Self-pay | Admitting: Family Medicine

## 2010-12-03 DIAGNOSIS — Z124 Encounter for screening for malignant neoplasm of cervix: Secondary | ICD-10-CM

## 2010-12-03 DIAGNOSIS — I10 Essential (primary) hypertension: Secondary | ICD-10-CM

## 2010-12-03 DIAGNOSIS — Z01419 Encounter for gynecological examination (general) (routine) without abnormal findings: Secondary | ICD-10-CM | POA: Insufficient documentation

## 2010-12-03 DIAGNOSIS — B351 Tinea unguium: Secondary | ICD-10-CM

## 2010-12-03 DIAGNOSIS — N912 Amenorrhea, unspecified: Secondary | ICD-10-CM

## 2010-12-03 DIAGNOSIS — R51 Headache: Secondary | ICD-10-CM

## 2010-12-03 DIAGNOSIS — N760 Acute vaginitis: Secondary | ICD-10-CM

## 2010-12-03 LAB — POCT WET PREP (WET MOUNT): Yeast Wet Prep HPF POC: NEGATIVE

## 2010-12-03 LAB — POCT SKIN KOH: Skin KOH, POC: POSITIVE

## 2010-12-03 NOTE — Patient Instructions (Addendum)
It Has been a pleasure to meet you. Please schedule a lab visit for TSH, FSH, Cmet and Lipid profile. If results are abnormal we will try to contact you by phone. If within normal limits we will send their results via mail. Make a f/u appointment in 6 months but if you need it sooner please contact our office.

## 2010-12-06 ENCOUNTER — Other Ambulatory Visit: Payer: Commercial Indemnity

## 2010-12-06 ENCOUNTER — Encounter: Payer: Self-pay | Admitting: Family Medicine

## 2010-12-06 DIAGNOSIS — I10 Essential (primary) hypertension: Secondary | ICD-10-CM

## 2010-12-06 DIAGNOSIS — N912 Amenorrhea, unspecified: Secondary | ICD-10-CM

## 2010-12-06 LAB — LIPID PANEL
LDL Cholesterol: 64 mg/dL (ref 0–99)
Total CHOL/HDL Ratio: 2.5 Ratio
VLDL: 10 mg/dL (ref 0–40)

## 2010-12-06 LAB — COMPREHENSIVE METABOLIC PANEL
ALT: 27 U/L (ref 0–35)
AST: 30 U/L (ref 0–37)
Alkaline Phosphatase: 57 U/L (ref 39–117)
CO2: 29 mEq/L (ref 19–32)
Sodium: 139 mEq/L (ref 135–145)
Total Bilirubin: 0.3 mg/dL (ref 0.3–1.2)
Total Protein: 7.1 g/dL (ref 6.0–8.3)

## 2010-12-06 LAB — TSH: TSH: 0.77 u[IU]/mL (ref 0.350–4.500)

## 2010-12-06 LAB — FOLLICLE STIMULATING HORMONE: FSH: 86.6 m[IU]/mL

## 2010-12-06 NOTE — Assessment & Plan Note (Addendum)
On HCTZ and controlled.  Plan:  Complete Metabolic Panel ordered and Lipid Profile as part of her annual f/u.  Advise on diet and weight reduction.

## 2010-12-06 NOTE — Assessment & Plan Note (Signed)
Positive KOH of toenail scrapings.  Plan:  Evaluate liver and kidney function tests  If within normal limits probably start Lamisil (terbinafine)

## 2010-12-06 NOTE — Assessment & Plan Note (Signed)
LMP December/2011 Plan:  FSH and TSH. Evaluate amenorrhea pending this results. Offered contraceptive methods but she wants to keep abstinence as method preferred at this time.

## 2010-12-06 NOTE — Assessment & Plan Note (Addendum)
Characteristic of tension headache with migraine features. Plan: Continue use of analgesics to alleviate symptoms. Discussed red flags to be aware and seek medical care if they appear.  She has appointment to check her vision next week.

## 2010-12-06 NOTE — Progress Notes (Signed)
FSH,CMP,FLP AND TSH DONE TODAY Veronica Coleman

## 2010-12-06 NOTE — Progress Notes (Signed)
  Subjective:    Patient ID: Veronica Coleman, female    DOB: 07-Jul-1965, 45 y.o.   MRN: [id number]  HPI Patient with hx of HTN, obesity that came with several complaints.  #1 Headache that is affecting her since 2010. It happens sporadically and last for couple of hours. It is located mostly on left side of her head  and it is described as pulsatile no associated with nausea, vomiting, photophobia. No preceded by aura and no nuchal rigidity, fever or chills, no rhinorrhea present with headache. Alleviate with ibuprofen. #2 Pt LMP was December/2011. No sexually active because does not use any form of birth control and was concerned if what she is experiencing is related to her menopause or not. No hot flashes or other menopause related symptoms. [id number]. Complaints about white vaginal discharge, non pruritic and no malodorous.    #3 Toenails with fungal infection. Treated in the past but never completed a full course of treatment. It is bothering more now as other toenails are starting to be involved. Regarding her chronic problems she is compliant with Hypertension treatment. No intention on weight reduction.   Review of Systems Please refer to HPI    Objective:   Physical Exam  Constitutional: She is oriented to person, place, and time. She appears well-developed. No distress.  HENT:  Head: Normocephalic and atraumatic.  Neck: Neck supple. No thyromegaly present.  Cardiovascular: Normal rate, regular rhythm and normal heart sounds.   Pulmonary/Chest: Breath sounds normal. She has no rales.  Abdominal: Soft. Bowel sounds are normal. She exhibits no distension. There is no tenderness.  Genitourinary: Uterus normal. Vaginal discharge found.       Vaginal discharge that is white, clumped and odorless. Cervix with no macroscopic lesion, and no erythema present.  Musculoskeletal: She exhibits no edema.  Neurological: She is alert and oriented to person, place, and time.          Assessment &  Plan:

## 2010-12-07 ENCOUNTER — Encounter: Payer: Self-pay | Admitting: Family Medicine

## 2010-12-09 ENCOUNTER — Encounter: Payer: Self-pay | Admitting: Family Medicine

## 2010-12-09 ENCOUNTER — Other Ambulatory Visit: Payer: Self-pay | Admitting: Family Medicine

## 2010-12-09 ENCOUNTER — Telehealth: Payer: Self-pay | Admitting: Family Medicine

## 2010-12-09 DIAGNOSIS — B351 Tinea unguium: Secondary | ICD-10-CM

## 2010-12-09 MED ORDER — TERBINAFINE HCL 250 MG PO TABS: 250.0000 mg | ORAL_TABLET | Freq: Every day | ORAL | Status: DC

## 2010-12-09 NOTE — Telephone Encounter (Signed)
Called Veronica Coleman and informed her about [facility] results in menopause range. Also about new prescription of Lamisil ( toenails onychomycosis ) for 12 weeks and repeat CMet in 6 weeks to evaluate liver, kidney function while she is on this medication. She agreed with plan.

## 2011-02-21 ENCOUNTER — Encounter: Payer: Self-pay | Admitting: Family Medicine

## 2011-02-21 ENCOUNTER — Ambulatory Visit (INDEPENDENT_AMBULATORY_CARE_PROVIDER_SITE_OTHER): Payer: Commercial Indemnity | Admitting: Family Medicine

## 2011-02-21 DIAGNOSIS — R197 Diarrhea, unspecified: Secondary | ICD-10-CM | POA: Insufficient documentation

## 2011-02-21 NOTE — Progress Notes (Signed)
  Subjective:    Patient ID: Veronica Coleman, female    DOB: November 16, 1965, 45 y.o.   MRN: [id number]  HPI Patient with diarrhea starting last night. She states that since last night she's had chills and subjective fevers. She has some mild cramping when she has to have a bowel movement, however in between she only has some mild back pain that feels like muscle strain. She denies any sick contacts. She states that she ate her normal foods yesterday and that no one else is sick. She denies eating any undercooked foods she does not have any contact with animals. She feels that she is able to take by mouth, however she has not been drinking much because she is worried that will contribute her diarrhea. She describes her diarrhea as watery however she denies any blood or mucus in it. She's never had anything like this before. She has not had recent antibiotic use.   Review of Systems    denies headache, chest pain, shortness of breath. Objective:   Physical Exam Vital signs reviewed General appearance - alert, well appearing, and in no distress and oriented to person, place, and time HEENT-moist mucous membranes, sclera nonicteric Heart - normal rate, regular rhythm, normal S1, S2, no murmurs, rubs, clicks or gallops Chest - clear to auscultation, no wheezes, rales or rhonchi, symmetric air entry, no tachypnea, retractions or cyanosis Abdomen - soft, nontender, nondistended, no masses or organomegaly-active bowel sounds Extremities - peripheral pulses normal, no pedal edema, no clubbing or cyanosis         Assessment & Plan:

## 2011-02-21 NOTE — Assessment & Plan Note (Signed)
Less than 12 hours of diarrhea. Patient with subjective fevers and a temperature of 99.9 in office. Without significant pain, without nausea or vomiting. Able to take by mouth. Not dehydrated. Will have patient hydrate at home. Gave patient the up to date diarrheal patient instructions. Patient was instructed to not take her Motrin. She may take Tylenol for pain. She was given a list of red flags to return earlier, however she was told that if she still has diarrhea on Wednesday she should come back and be seen. She was told not to work until her diarrhea stopped and she is at [employer].

## 2011-02-21 NOTE — Patient Instructions (Signed)
I am giving you some instructions on diarrhea. Please do not take your motrin while you have diarrhea Please stay out of work while you have diarrhea  Please come back on Wednesday if you are still sick.

## 2011-04-18 ENCOUNTER — Encounter: Payer: Self-pay | Admitting: Family Medicine

## 2011-04-18 ENCOUNTER — Ambulatory Visit (INDEPENDENT_AMBULATORY_CARE_PROVIDER_SITE_OTHER): Payer: Commercial Indemnity | Admitting: Family Medicine

## 2011-04-18 VITALS — BP 131/87 | HR 82 | Temp 98.1°F | Ht 69.0 in | Wt 208.8 lb

## 2011-04-18 DIAGNOSIS — B351 Tinea unguium: Secondary | ICD-10-CM

## 2011-04-18 DIAGNOSIS — Z23 Encounter for immunization: Secondary | ICD-10-CM

## 2011-04-18 LAB — COMPREHENSIVE METABOLIC PANEL
AST: 20 U/L (ref 0–37)
Albumin: 4.2 g/dL (ref 3.5–5.2)
Alkaline Phosphatase: 63 U/L (ref 39–117)
BUN: 9 mg/dL (ref 6–23)
Calcium: 9.3 mg/dL (ref 8.4–10.5)
Chloride: 103 mEq/L (ref 96–112)
Glucose, Bld: 100 mg/dL — ABNORMAL HIGH (ref 70–99)
Potassium: 4.3 mEq/L (ref 3.5–5.3)
Sodium: 138 mEq/L (ref 135–145)
Total Protein: 7.2 g/dL (ref 6.0–8.3)

## 2011-04-18 NOTE — Progress Notes (Signed)
  Subjective:    Patient ID: Veronica Coleman, female    DOB: [birth year], 45 y.o.   MRN: [id number]  HPI Pt comes today for f/u her toes Onychomycosis. She took the complete course of treatment (12 weeks of Lamisil) She did not come for lab check at the week #6 of as she agreed. She does not have other complaint today. Had one event of diarrhea in the past but seemed to be a viral episode that resolved on its own. Pt gets lab drawn today for CMET.    Review of Systems Per HPI    Objective:   Physical Exam  HENT:  Mouth/Throat: Oropharynx is clear and moist.  Cardiovascular: Normal rate and regular rhythm.   Pulmonary/Chest: Breath sounds normal.  Abdominal: Bowel sounds are normal.  Musculoskeletal:       Toenails with residual onychomycosis lesions on 1st toe bilaterally.  Neurological: She is alert.  Skin: Skin is warm and dry. No rash noted. No erythema.          Assessment & Plan:

## 2011-04-18 NOTE — Patient Instructions (Signed)
It has been a pleasure to see you today. I will call you with the labs results if they come back abnormal otherwise you will receive a letter. Make your next appointment in 4 months to follow up your hypertension.

## 2011-04-18 NOTE — Assessment & Plan Note (Addendum)
Toe nails seem improved. No completely resolved. Pt took a total of 12 weeks treatment. F/U CMET WNL.  Plan: Reevaluate in next visit.

## 2011-04-29 ENCOUNTER — Encounter: Payer: Self-pay | Admitting: Family Medicine

## 2011-05-27 ENCOUNTER — Encounter (HOSPITAL_COMMUNITY): Payer: Self-pay | Admitting: *Deleted

## 2011-05-27 ENCOUNTER — Emergency Department (HOSPITAL_COMMUNITY)
Admission: EM | Admit: 2011-05-27 | Discharge: 2011-05-27 | Disposition: A | Discharge status: Home / Self Care | Payer: Commercial Indemnity | Source: Home / Self Care | Attending: Emergency Medicine | Admitting: Emergency Medicine

## 2011-05-27 DIAGNOSIS — M76899 Other specified enthesopathies of unspecified lower limb, excluding foot: Secondary | ICD-10-CM

## 2011-05-27 DIAGNOSIS — M6580 Other synovitis and tenosynovitis, unspecified site: Secondary | ICD-10-CM

## 2011-05-27 HISTORY — DX: Essential (primary) hypertension: I10

## 2011-05-27 MED ORDER — MELOXICAM 15 MG PO TABS: 15.0000 mg | ORAL_TABLET | Freq: Every day | ORAL | Status: DC

## 2011-05-27 NOTE — ED Notes (Signed)
Pt  Reports   She  Twisted  Her  r  Knee  -  She  Reports  The    Symptoms    Got  Worse  Several  Days  After  The  Injury  -  She  Now  Reports  Increase  In pain   As  Well  As   Swelling  And increase  Discomfort  On  Movement   She  Ambulates  With a  Limp

## 2011-05-27 NOTE — ED Provider Notes (Signed)
Veronica Coleman is a 46 y.o. female who presents to Urgent Care today for right knee pain. She had a twisting injury to her right knee about 2 weeks ago. She developed pain in a few days later. She notes pain with ambulation and going up and downstairs on the anterior aspect of her right knee. When asked specifically where she points to her patellar tendon and quadriceps tendons.  She denies any instability of her name locking or catching. She feels well otherwise.   PMH reviewed.  ROS as above otherwise neg Medications reviewed. No current facility-administered medications for this encounter.   Current Outpatient Prescriptions  Medication Sig Dispense Refill  . cetirizine (ZYRTEC) 10 MG tablet Take 10 mg by mouth at bedtime.        . fluticasone (FLONASE) 50 MCG/ACT nasal spray 2 sprays by Nasal route daily.        . hydrochlorothiazide 25 MG tablet Take 1 tablet (25 mg total) by mouth daily.  30 tablet  6  . ibuprofen (ADVIL,MOTRIN) 600 MG tablet Take 1 tablet (600 mg total) by mouth every 6 (six) hours as needed for pain.  90 tablet  0    Exam:  BP 144/86  Pulse 79  Temp(Src) 98.7 F (37.1 C) (Oral)  Resp 20  SpO2 100% Gen: Well NAD MSK: Minimal right knee effusion.  No erythema. No joint line tenderness. Mild tender to palpation on the superior patellar pole.  Negative Lachman's anterior and posterior drawer. Negative McMurray's tests.  No pain or instability without the severe stress.  Pain with flexion beyond 90 on the quadriceps tendon.  Gait is painful.   Assessment and Plan: 46 year old woman with presumed Quad Tendon partial injury. Plan for meloxicam PRN. Additionally f[facility]u with [facility] for ultrasound to further diagnosis. She expresses understanding.      Clementeen Graham, MD 05/27/11 2000

## 2011-06-01 NOTE — ED Provider Notes (Signed)
Medical screening examination/treatment/procedure(s) were performed by resident and as supervising physician I was immediately available for consultation/collaboration.  Luiz Blare MD   Luiz Blare, MD 06/01/11 [phone]

## 2011-06-04 ENCOUNTER — Other Ambulatory Visit: Payer: Self-pay | Admitting: Family Medicine

## 2011-06-29 ENCOUNTER — Other Ambulatory Visit: Payer: Self-pay | Admitting: Family Medicine

## 2011-06-29 NOTE — Telephone Encounter (Signed)
Refill request for patient of Dr. Aviva Signs

## 2011-10-21 ENCOUNTER — Other Ambulatory Visit: Payer: Self-pay | Admitting: Family Medicine

## 2011-10-21 DIAGNOSIS — Z1231 Encounter for screening mammogram for malignant neoplasm of breast: Secondary | ICD-10-CM

## 2011-11-01 ENCOUNTER — Ambulatory Visit (INDEPENDENT_AMBULATORY_CARE_PROVIDER_SITE_OTHER): Payer: Commercial Indemnity | Admitting: Family Medicine

## 2011-11-01 ENCOUNTER — Encounter: Payer: Self-pay | Admitting: Family Medicine

## 2011-11-01 VITALS — BP 137/92 | HR 96 | Temp 97.8°F | Ht 69.0 in | Wt 206.4 lb

## 2011-11-01 DIAGNOSIS — M722 Plantar fascial fibromatosis: Secondary | ICD-10-CM | POA: Insufficient documentation

## 2011-11-01 MED ORDER — IBUPROFEN 600 MG PO TABS: 600.0000 mg | ORAL_TABLET | Freq: Three times a day (TID) | ORAL | Status: DC | PRN

## 2011-11-01 NOTE — Assessment & Plan Note (Signed)
1) Pt has had B/L medial calcaneal heel pain x 1 month with worsening Sx over the past two days with increased activity 2)Pt has worsening burning pain with activity as well as increased pain in morning after activity 3)Gave Pt handout on Plantar fascitis along with directions on home treatment including ice, ibuprofen, stretching, and insoles 4)Recommended Firm insoles in her work shoes, Rx ibuprofen 600 mg tid, calf stretches w/wout bands, and ice 20 minutes x 3 day 5)Will f/u in 1 month and gave note for work for today 6) If no improvement at next visit, will consider [employer] along with PT referral

## 2011-11-01 NOTE — Patient Instructions (Addendum)

## 2011-11-01 NOTE — Progress Notes (Signed)
Subjective: Veronica Coleman is a 46 y.o. year old female who presents today for B/L heel pain.  It began around for around 1 month ago attributing it to her shoes.  She did switch her shoes and noticed an improvement until yesterday after her daily walk around 30 minutes.  Pt describes pain as burning that is worse R > L and is slightly worse in her heel than the rest of her foot.  Pt did notice that her Sx were worse when getting out of bed this morning requiring her to place more pressure on her L foot than her R foot due to burning/pain sensation in R heel.  Does note some swelling on the L heel but does not believe she has any on the R heel.  Has tried tylenol but did not notice any relief despite two doses.  She states that she has not had this before as well.    Past Medical History  Diagnosis Date  . Hypertension     Occupational History  . Waitress   Social History Main Topics  . Smoking status: Never Smoker   . Smokeless tobacco: Not on file  . Alcohol Use: No  . Drug Use: No  . Sexually Active: Not Currently    Birth Control/ Protection: Abstinence     Objective: Filed Vitals:   11/01/11 1339  BP: 137/92  Pulse: 96  Temp: 97.8 F (36.6 C)   Gen: NAD, Well nourished, well developed  HEENT: EOMI B/L, PERLA, no conjunctival injection CV: RRR, no murmurs gallops or rubs Resp: CTA, no wheezes Abd: Obese, Non tender, NABS Ext: +1 edema medial calcaneous B/L L>R, +TTP at medial calcaneous B/L MSK: Dorsal flexion ROM 20 degrees B/L and plantar flexion 50 degrees B/L Neuro: +2 Patellar/Achilles reflex B/L. MS 5/5 LE

## 2011-11-28 ENCOUNTER — Ambulatory Visit
Admission: RE | Admit: 2011-11-28 | Discharge: 2011-11-28 | Disposition: A | Discharge status: Home / Self Care | Payer: Commercial Indemnity | Source: Ambulatory Visit | Attending: Family Medicine | Admitting: Family Medicine

## 2011-11-28 DIAGNOSIS — Z1231 Encounter for screening mammogram for malignant neoplasm of breast: Secondary | ICD-10-CM

## 2011-12-20 ENCOUNTER — Encounter: Payer: Self-pay | Admitting: Family Medicine

## 2011-12-20 ENCOUNTER — Ambulatory Visit (INDEPENDENT_AMBULATORY_CARE_PROVIDER_SITE_OTHER): Payer: Commercial Indemnity | Admitting: Family Medicine

## 2011-12-20 VITALS — BP 125/80 | HR 86 | Temp 98.1°F | Ht 69.0 in | Wt 210.2 lb

## 2011-12-20 DIAGNOSIS — I10 Essential (primary) hypertension: Secondary | ICD-10-CM

## 2011-12-20 DIAGNOSIS — E669 Obesity, unspecified: Secondary | ICD-10-CM

## 2011-12-20 DIAGNOSIS — J309 Allergic rhinitis, unspecified: Secondary | ICD-10-CM

## 2011-12-20 DIAGNOSIS — N912 Amenorrhea, unspecified: Secondary | ICD-10-CM

## 2011-12-20 DIAGNOSIS — M722 Plantar fascial fibromatosis: Secondary | ICD-10-CM

## 2011-12-20 LAB — LIPID PANEL
Cholesterol: 143 mg/dL (ref 0–200)
HDL: 46 mg/dL (ref >39)
LDL Cholesterol: 81 mg/dL (ref 0–99)
Triglycerides: 82 mg/dL (ref <150)
VLDL: 16 mg/dL (ref 0–40)

## 2011-12-20 LAB — CBC
HCT: 38 % (ref 36.0–46.0)
Hemoglobin: 12.6 g/dL (ref 12.0–15.0)
MCH: 28.1 pg (ref 26.0–34.0)
MCHC: 33.2 g/dL (ref 30.0–36.0)
MCV: 84.6 fL (ref 78.0–100.0)
Platelets: 245 10*3/uL (ref 150–400)
RBC: 4.49 MIL/uL (ref 3.87–5.11)
RDW: 14.3 % (ref 11.5–15.5)
WBC: 4.9 10*3/uL (ref 4.0–10.5)

## 2011-12-20 LAB — BASIC METABOLIC PANEL
CO2: 29 mEq/L (ref 19–32)
Calcium: 9.5 mg/dL (ref 8.4–10.5)
Creat: 0.69 mg/dL (ref 0.50–1.10)
Glucose, Bld: 92 mg/dL (ref 70–99)
Sodium: 141 mEq/L (ref 135–145)

## 2011-12-20 LAB — POCT GLYCOSYLATED HEMOGLOBIN (HGB A1C): Hemoglobin A1C: 6

## 2011-12-20 MED ORDER — HYDROCHLOROTHIAZIDE 25 MG PO TABS: 25.0000 mg | ORAL_TABLET | Freq: Every day | ORAL | Status: DC

## 2011-12-20 NOTE — Progress Notes (Signed)
  Subjective:    Patient ID: Veronica Coleman, female    DOB: 1965/10/08, 46 y.o.   MRN: [id number]  HPI Pt comes today for a physical.   1. Amenorrhea: LMP  Feb /2012. Since then pt has had on an off (irregular)  4-5 days of sanguinous tinted vaginal discharge. No pain, no active bleeding, no other complaints. She has flushes and no interest on sex, but when engaged on sexual activity denies pain or dryness. No concerns for STD's. No symptoms of depression, no sleep disturbances.  2. Plantar fascitis: bilateral, diagnosed on 7/ 2/13 and recommended ice, ibuprofen, stretching, and insoles at that visit. Pt has done them inconsistently. She thinks that they don't help much. Her pain is slightly better and does not make her pursue further therapy at this point..   3. Obesity: Pt has gained weight. She attributes it to the lack of exercise due to her foot discomfort. She has not tried other no weight baring exercises or changed her diet.  4. HTN: Controlled. Pt compliant with 25 mg HCTZ. No side effects noted. No chest pain, palpitations, HA. Denies muscular weakness or other symptoms.  5. Allergies: Controled with Flonase. No side effects noted.   Review of Systems Per HPI    Objective:   Physical Exam  Constitutional: She is oriented to person, place, and time. She appears well-developed. No distress.  HENT:  Head: Normocephalic.  Right Ear: External ear normal.  Left Ear: External ear normal.  Mouth/Throat: Oropharynx is clear and moist.  Eyes: EOM are normal. Pupils are equal, round, and reactive to light.       Normal fundoscopy.   Neck: Normal range of motion. Neck supple. No thyromegaly present.  Cardiovascular: Normal rate, regular rhythm and normal heart sounds.   No murmur heard. Pulmonary/Chest: Effort normal and breath sounds normal.  Abdominal: Soft. Bowel sounds are normal. There is no tenderness.  Genitourinary:       Mild rectocele noted. No vaginal discharge. No cervical  motion tenderness. Adnexa no palpable.   Musculoskeletal: Normal range of motion. She exhibits no edema.  Neurological: She is alert and oriented to person, place, and time. She has normal reflexes.      Assessment & Plan:

## 2011-12-20 NOTE — Assessment & Plan Note (Addendum)
BMI increased since July/2013. Pt has decreased her activity level due to plantar fascitis pain. She is also not following a diet regimen, but tries to eat healthy. She is interested in losing weight.  Plan: DASH diet recommended and information given. Advise to type of exercise like swimming or using machines (elyptical, stationary bike) that has less impact on her feet.   Lipid profile, A1C.

## 2011-12-20 NOTE — Assessment & Plan Note (Addendum)
Controlled on HCTZ 25 mg daily.  Plan: Continue medication. Recommended life style changes and DASH diet. CBC, Bmet.

## 2011-12-20 NOTE — Assessment & Plan Note (Addendum)
Symptoms have mildly improved.  Plan: Discussed the chronic nature of this condition. Pt declines further testing/more aggressive therapy. Continue conservative treatment. If conditions worsens agree with the possibility of imaging studies and physical therapy.

## 2011-12-20 NOTE — Assessment & Plan Note (Signed)
No symptoms reported. Pt uses Flonase with good response to therapy Plan  Continue  Flonase PRN

## 2011-12-20 NOTE — Assessment & Plan Note (Addendum)
Hot flashes. Elevated FSH. Intervals of amenorrhea and 4-5 days light bloody tinted vaginal discharge. No active bleeding. No other symptoms. Negative exam. Most likely still on late phase of menopausal transition.  Plan: Discussed with pt that if active bleeding occurs, she needs to get evaluated. No further testing indicated at this time.

## 2011-12-20 NOTE — Patient Instructions (Addendum)
It has been a pleasure to see you today. Please continue taking your HCTZ at current dose. I will call you with the labs results if they come back abnormal otherwise you will receive a letter. Make your next appointment in 6 months. DASH Diet The DASH diet stands for "Dietary Approaches to Stop Hypertension." It is a healthy eating plan that has been shown to reduce high blood pressure (hypertension) in as little as 14 days, while also possibly providing other significant health benefits. These other health benefits include reducing the risk of breast cancer after menopause and reducing the risk of type 2 diabetes, heart disease, colon cancer, and stroke. Health benefits also include weight loss and slowing kidney failure in patients with chronic kidney disease.  DIET GUIDELINES  Limit salt (sodium). Your diet should contain less than 1500 mg of sodium daily.   Limit refined or processed carbohydrates. Your diet should include mostly whole grains. Desserts and added sugars should be used sparingly.   Include small amounts of heart-healthy fats. These types of fats include nuts, oils, and tub margarine. Limit saturated and trans fats. These fats have been shown to be harmful in the body.  CHOOSING FOODS  The following food groups are based on a 2000 calorie diet. See your Registered Dietitian for individual calorie needs. Grains and Grain Products (6 to 8 servings daily)  Eat More Often: Whole-wheat bread, brown rice, whole-grain or wheat pasta, quinoa, popcorn without added fat or salt (air popped).   Eat Less Often: White bread, white pasta, white rice, cornbread.  Vegetables (4 to 5 servings daily)  Eat More Often: Fresh, frozen, and canned vegetables. Vegetables may be raw, steamed, roasted, or grilled with a minimal amount of fat.   Eat Less Often/Avoid: Creamed or fried vegetables. Vegetables in a cheese sauce.  Fruit (4 to 5 servings daily)  Eat More Often: All fresh, canned (in  natural juice), or frozen fruits. Dried fruits without added sugar. One hundred percent fruit juice ( cup [237 mL] daily).   Eat Less Often: Dried fruits with added sugar. Canned fruit in light or heavy syrup.  [employer], Fish, and Poultry (2 servings or less daily. One serving is 3 to 4 oz [85-114 g]).  Eat More Often: Ninety percent or leaner ground beef, tenderloin, sirloin. Round cuts of beef, chicken breast, [place] breast. All fish. Grill, bake, or broil your meat. Nothing should be fried.   Eat Less Often/Avoid: Fatty cuts of meat, [place], or chicken leg, thigh, or wing. Fried cuts of meat or fish.  Dairy (2 to 3 servings)  Eat More Often: Low-fat or fat-free milk, low-fat plain or light yogurt, reduced-fat or part-skim cheese.   Eat Less Often/Avoid: Milk (whole, 2%, skim, or chocolate).Whole milk yogurt. Full-fat cheeses.  Nuts, Seeds, and Legumes (4 to 5 servings per week)  Eat More Often: All without added salt.   Eat Less Often/Avoid: Salted nuts and seeds, canned beans with added salt.  Fats and Sweets (limited)  Eat More Often: Vegetable oils, tub margarines without trans fats, sugar-free gelatin. Mayonnaise and salad dressings.   Eat Less Often/Avoid: Coconut oils, palm oils, butter, stick margarine, cream, half and half, cookies, candy, pie.  FOR MORE INFORMATION The Dash Diet Eating Plan: www.dashdiet.org Document Released: 04/07/2011 Document Reviewed: 03/28/2011 [facility] Patient Information 2012 [place], [place].

## 2012-01-03 ENCOUNTER — Telehealth: Payer: Self-pay | Admitting: Family Medicine

## 2012-01-03 NOTE — Telephone Encounter (Signed)
Will mail labs to pt as all are WNL.

## 2012-01-03 NOTE — Telephone Encounter (Signed)
Pt is asking for the doctor to send results of her labs in the mail

## 2012-02-06 ENCOUNTER — Encounter: Payer: Self-pay | Admitting: Family Medicine

## 2012-02-06 ENCOUNTER — Ambulatory Visit
Admission: RE | Admit: 2012-02-06 | Discharge: 2012-02-06 | Disposition: A | Discharge status: Home / Self Care | Payer: Commercial Indemnity | Source: Ambulatory Visit | Attending: Family Medicine | Admitting: Family Medicine

## 2012-02-06 ENCOUNTER — Ambulatory Visit (INDEPENDENT_AMBULATORY_CARE_PROVIDER_SITE_OTHER): Payer: Commercial Indemnity | Admitting: Family Medicine

## 2012-02-06 ENCOUNTER — Telehealth: Payer: Self-pay | Admitting: *Deleted

## 2012-02-06 VITALS — BP 148/90 | HR 90 | Temp 98.8°F | Ht 69.0 in | Wt 209.0 lb

## 2012-02-06 DIAGNOSIS — M545 Low back pain, unspecified: Secondary | ICD-10-CM

## 2012-02-06 DIAGNOSIS — B351 Tinea unguium: Secondary | ICD-10-CM

## 2012-02-06 DIAGNOSIS — R1031 Right lower quadrant pain: Secondary | ICD-10-CM | POA: Insufficient documentation

## 2012-02-06 MED ORDER — DICLOFENAC SODIUM 75 MG PO TBEC: 75.0000 mg | DELAYED_RELEASE_TABLET | Freq: Two times a day (BID) | ORAL | Status: DC

## 2012-02-06 MED ORDER — DOXYCYCLINE HYCLATE 100 MG PO CAPS: 100.0000 mg | ORAL_CAPSULE | Freq: Two times a day (BID) | ORAL | Status: DC

## 2012-02-06 NOTE — Patient Instructions (Signed)
Abscess An abscess is an infected area that contains a collection of pus and debris.It can occur in almost any part of the body. An abscess is also known as a furuncle or boil. CAUSES  An abscess occurs when tissue gets infected. This can occur from blockage of oil or sweat glands, infection of hair follicles, or a minor injury to the skin. As the body tries to fight the infection, pus collects in the area and creates pressure under the skin. This pressure causes pain. People with weakened immune systems have difficulty fighting infections and get certain abscesses more often.  SYMPTOMS Usually an abscess develops on the skin and becomes a painful mass that is red, warm, and tender. If the abscess forms under the skin, you may feel a moveable soft area under the skin. Some abscesses break open (rupture) on their own, but most will continue to get worse without care. The infection can spread deeper into the body and eventually into the bloodstream, causing you to feel ill.  DIAGNOSIS  Your caregiver will take your medical history and perform a physical exam. A sample of fluid may also be taken from the abscess to determine what is causing your infection. TREATMENT  Your caregiver may prescribe antibiotic medicines to fight the infection. However, taking antibiotics alone usually does not cure an abscess. Your caregiver may need to make a small cut (incision) in the abscess to drain the pus. In some cases, gauze is packed into the abscess to reduce pain and to continue draining the area. HOME CARE INSTRUCTIONS   Only take over-the-counter or prescription medicines for pain, discomfort, or fever as directed by your caregiver.  If you were prescribed antibiotics, take them as directed. Finish them even if you start to feel better.  If gauze is used, follow your caregiver's directions for changing the gauze.  To avoid spreading the infection:  Keep your draining abscess covered with a  bandage.  Wash your hands well.  Do not share personal care items, towels, or whirlpools with others.  Avoid skin contact with others.  Keep your skin and clothes clean around the abscess.  Keep all follow-up appointments as directed by your caregiver. SEEK MEDICAL CARE IF:   You have increased pain, swelling, redness, fluid drainage, or bleeding.  You have muscle aches, chills, or a general ill feeling.  You have a fever. MAKE SURE YOU:   Understand these instructions.  Will watch your condition.  Will get help right away if you are not doing well or get worse. Document Released: 01/26/2005 Document Revised: 10/18/2011 Document Reviewed: 07/01/2011 [facility] Patient Information 2013 [place], [place]. Back Exercises Back exercises help treat and prevent back injuries. The goal of back exercises is to increase the strength of your abdominal and back muscles and the flexibility of your back. These exercises should be started when you no longer have back pain. Back exercises include:  Pelvic Tilt. Lie on your back with your knees bent. Tilt your pelvis until the lower part of your back is against the floor. Hold this position 5 to 10 sec and repeat 5 to 10 times.  Knee to Chest. Pull first 1 knee up against your chest and hold for 20 to 30 seconds, repeat this with the other knee, and then both knees. This may be done with the other leg straight or bent, whichever feels better.  Sit-Ups or Curl-Ups. Bend your knees 90 degrees. Start with tilting your pelvis, and do a partial, slow sit-up, lifting  your trunk only 30 to 45 degrees off the floor. Take at least 2 to 3 seconds for each sit-up. Do not do sit-ups with your knees out straight. If partial sit-ups are difficult, simply do the above but with only tightening your abdominal muscles and holding it as directed.  Hip-Lift. Lie on your back with your knees flexed 90 degrees. Push down with your feet and shoulders as you raise your hips  a couple inches off the floor; hold for 10 seconds, repeat 5 to 10 times.  Back arches. Lie on your stomach, propping yourself up on bent elbows. Slowly press on your hands, causing an arch in your low back. Repeat 3 to 5 times. Any initial stiffness and discomfort should lessen with repetition over time.  Shoulder-Lifts. Lie face down with arms beside your body. Keep hips and torso pressed to floor as you slowly lift your head and shoulders off the floor. Do not overdo your exercises, especially in the beginning. Exercises may cause you some mild back discomfort which lasts for a few minutes; however, if the pain is more severe, or lasts for more than 15 minutes, do not continue exercises until you see your caregiver. Improvement with exercise therapy for back problems is slow.  See your caregivers for assistance with developing a proper back exercise program. Document Released: 05/26/2004 Document Revised: 07/11/2011 Document Reviewed: 02/17/2011 [facility] Patient Information 2013 [place], [place]. Back Pain, Adult Low back pain is very common. About 1 in 5 people have back pain.The cause of low back pain is rarely dangerous. The pain often gets better over time.About half of people with a sudden onset of back pain feel better in just 2 weeks. About 8 in 10 people feel better by 6 weeks.  CAUSES Some common causes of back pain include:  Strain of the muscles or ligaments supporting the spine.  Wear and tear (degeneration) of the spinal discs.  Arthritis.  Direct injury to the back. DIAGNOSIS Most of the time, the direct cause of low back pain is not known.However, back pain can be treated effectively even when the exact cause of the pain is unknown.Answering your caregiver's questions about your overall health and symptoms is one of the most accurate ways to make sure the cause of your pain is not dangerous. If your caregiver needs more information, he or she may order lab work or imaging  tests (X-rays or MRIs).However, even if imaging tests show changes in your back, this usually does not require surgery. HOME CARE INSTRUCTIONS For many people, back pain returns.Since low back pain is rarely dangerous, it is often a condition that people can learn to [facility] their own.   Remain active. It is stressful on the back to sit or stand in one place. Do not sit, drive, or stand in one place for more than 30 minutes at a time. Take short walks on level surfaces as soon as pain allows.Try to increase the length of time you walk each day.  Do not stay in bed.Resting more than 1 or 2 days can delay your recovery.  Do not avoid exercise or work.Your body is made to move.It is not dangerous to be active, even though your back may hurt.Your back will likely heal faster if you return to being active before your pain is gone.  Pay attention to your body when you bend and lift. Many people have less discomfortwhen lifting if they bend their knees, keep the load close to their bodies,and avoid twisting. Often,  the most comfortable positions are those that put less stress on your recovering back.  Find a comfortable position to sleep. Use a firm mattress and lie on your side with your knees slightly bent. If you lie on your back, put a pillow under your knees.  Only take over-the-counter or prescription medicines as directed by your caregiver. Over-the-counter medicines to reduce pain and inflammation are often the most helpful.Your caregiver may prescribe muscle relaxant drugs.These medicines help dull your pain so you can more quickly return to your normal activities and healthy exercise.  Put ice on the injured area.  Put ice in a plastic bag.  Place a towel between your skin and the bag.  Leave the ice on for 15 to 20 minutes, 3 to 4 times a day for the first 2 to 3 days. After that, ice and heat may be alternated to reduce pain and spasms.  Ask your caregiver about trying back  exercises and gentle massage. This may be of some benefit.  Avoid feeling anxious or stressed.Stress increases muscle tension and can worsen back pain.It is important to recognize when you are anxious or stressed and learn ways to manage it.Exercise is a great option. SEEK MEDICAL CARE IF:  You have pain that is not relieved with rest or medicine.  You have pain that does not improve in 1 week.  You have new symptoms.  You are generally not feeling well. SEEK IMMEDIATE MEDICAL CARE IF:   You have pain that radiates from your back into your legs.  You develop new bowel or bladder control problems.  You have unusual weakness or numbness in your arms or legs.  You develop nausea or vomiting.  You develop abdominal pain.  You feel faint. Document Released: 04/18/2005 Document Revised: 10/18/2011 Document Reviewed: 09/06/2010 [facility] Patient Information 2013 [place], [place].  Ringworm, Nail A fungal infection of the nail (tinea unguium/onychomycosis) is common. It is common as the visible part of the nail is composed of dead cells which have no blood supply to help prevent infection. It occurs because fungi are everywhere and will pick any opportunity to grow on any dead material. Because nails are very slow growing they require up to 2 years of treatment with anti-fungal medications. The entire nail back to the base is infected. This includes approximately  of the nail which you cannot see. If your caregiver has prescribed a medication by mouth, take it every day and as directed. No progress will be seen for at least 6 to 9 months. Do not be disappointed! Because fungi live on dead cells with little or no exposure to blood supply, medication delivery to the infection is slow; thus the cure is slow. It is also why you can observe no progress in the first 6 months. The nail becoming cured is the base of the nail, as it has the blood supply. Topical medication such as creams and ointments  are usually not effective. Important in successful treatment of nail fungus is closely following the medication regimen that your doctor prescribes. Sometimes you and your caregiver may elect to speed up this process by surgical removal of all the nails. Even this may still require 6 to 9 months of additional oral medications. See your caregiver as directed. Remember there will be no visible improvement for at least 6 months. See your caregiver sooner if other signs of infection (redness and swelling) develop. Document Released: 04/15/2000 Document Revised: 07/11/2011 Document Reviewed: 06/24/2008 [facility] Patient Information 2013 [place], [place].

## 2012-02-06 NOTE — Assessment & Plan Note (Signed)
Exercise, NSAIDS, heat prn.  Check LS series.

## 2012-02-06 NOTE — Telephone Encounter (Signed)
Message copied by Aram Beecham on Mon Feb 06, 2012  4:48 PM ------      Message from: Reva Bores      Created: Mon Feb 06, 2012  4:08 PM       X-rays show mild arthritis.  If medicine and exercises are not improving, may need PT referral--please advise pt.

## 2012-02-06 NOTE — Progress Notes (Signed)
  Subjective:    Patient ID: Veronica Coleman, female    DOB: 27-Apr-1966, 46 y.o.   MRN: [id number]  HPI  Here for multiple complaints. 1.  RLQ pain x 1 wk.  2 "pimples noted in hair line.  Pain is lower groin and tender to palpation.  Does not radiate, is not better.  She does shave her pubic area. 2.  Back pain for a long time.  No h/o x-rays.  Trial of NSAIDS in the past without improvement. 3.  Toe nail fungus-has had multiple treatments with Lamisil without help.  Has Urea cream from dermatology.  Review of Systems  HENT: Negative for congestion, rhinorrhea and neck pain.   Respiratory: Negative for shortness of breath.   Cardiovascular: Negative for chest pain.  Gastrointestinal: Negative for abdominal pain.  Musculoskeletal: Positive for back pain.  Skin:       Thickened nails       Objective:   Physical Exam  Vitals reviewed. Constitutional: She appears well-developed and well-nourished.  HENT:  Head: Atraumatic.  Eyes: No scleral icterus.  Neck: Neck supple.  Cardiovascular: Normal rate.   Pulmonary/Chest: Effort normal.  Abdominal: Soft. She exhibits no mass. There is no tenderness.  Genitourinary: There is lesion (furuncle on right labia majora and on right perineum) on the right labia.  Lymphadenopathy:       Right: Inguinal (tender) adenopathy present.       Left: No inguinal adenopathy present.  Skin:       Thickened nails on great toes bilaterally          Assessment & Plan:

## 2012-02-06 NOTE — Assessment & Plan Note (Signed)
Likely just lymphadenopathy related to skin abscess.  Stop shaving.

## 2012-02-06 NOTE — Assessment & Plan Note (Signed)
Has failed multiple treatments, last with derm--will refer to podiatry.

## 2012-02-06 NOTE — Telephone Encounter (Signed)
Left message on voicemail for patient to call back for xray results.

## 2012-02-16 NOTE — Telephone Encounter (Signed)
Patient is calling for the results of her Xray. °

## 2012-02-16 NOTE — Telephone Encounter (Signed)
LVMOM for patient to call back to inform her of the results stated below

## 2012-02-16 NOTE — Telephone Encounter (Signed)
Spoke with patient and informed her of below results 

## 2012-05-02 ENCOUNTER — Emergency Department (HOSPITAL_COMMUNITY)
Admission: EM | Admit: 2012-05-02 | Discharge: 2012-05-02 | Disposition: A | Discharge status: Home / Self Care | Payer: PRIVATE HEALTH INSURANCE | Source: Home / Self Care | Attending: Emergency Medicine | Admitting: Emergency Medicine

## 2012-05-02 ENCOUNTER — Encounter (HOSPITAL_COMMUNITY): Payer: Self-pay | Admitting: *Deleted

## 2012-05-02 DIAGNOSIS — T169XXA Foreign body in ear, unspecified ear, initial encounter: Secondary | ICD-10-CM

## 2012-05-02 NOTE — ED Provider Notes (Signed)
Chief Complaint  Patient presents with  . Foreign Body in Ear    History of Present Illness:   The patient is a 47 year old female who got the cotton tip of a Q-tip in her left ear canal about 2 days ago. She try using tweezers to remove it but was unsuccessful. It's not painful, irritated, or itchy. There's been no drainage. She denies fever, headache, nasal congestion, rhinorrhea, sore throat, or swollen glands.  Review of Systems:  Other than noted above, the patient denies any of the following symptoms: Systemic:  No fevers, chills, sweats, weight loss or gain, fatigue, or tiredness. Eye:  No redness, pain, discharge, itching, blurred vision, or diplopia. ENT:  No headache, nasal congestion, sneezing, itching, epistaxis, ear pain, congestion, decreased hearing, ringing in ears, vertigo, or tinnitus.  No oral lesions, sore throat, pain on swallowing, or hoarseness. Neck:  No mass, tenderness or adenopathy. Lungs:  No coughing, wheezing, or shortness of breath. Skin:  No rash or itching.  PMFSH:  Past medical history, family history, social history, meds, and allergies were reviewed.  Physical Exam:   Vital signs:  BP 145/83  Pulse 90  Temp 98.4 F (36.9 C) (Oral)  Resp 20  SpO2 98% General:  Alert and oriented.  In no distress.  Skin warm and dry. Eye:  PERRL, full EOMs, lids and conjunctiva normal.   ENT:  There was the cotton tip of a Q-tip in her distal left ear canal. This was easily removed with alligator forceps. Thereafter the TMs and canals were clear.  Nasal mucosa not congested and without drainage.  Mucous membranes moist, no oral lesions, normal dentition, pharynx clear.  No cranial or facial pain to palplation. Neck:  Supple, full ROM.  No adenopathy, tenderness or mass.  Thyroid normal. Lungs:  Breath sounds clear and equal bilaterally.  No wheezes, rales or rhonchi. Heart:  Rhythm regular, without extrasystoles.  No gallops or murmers. Skin:  Clear, warm and  dry.  Procedure Note:  Verbal informed consent was obtained from the patient.  Risks and benefits were outlined with the patient.  Patient understands and accepts these risks.  Identity of the patient was confirmed verbally and by armband.    Procedure was performed as follows:  Under direct visualization, the cotton tip of the Q-tip was removed using an alligator forceps.  Patient tolerated the procedure well without any immediate complications.   Assessment:  The encounter diagnosis was Foreign body in ear.  Plan:   1.  The following meds were prescribed:   New Prescriptions   No medications on file   2.  The patient was instructed in symptomatic care and handouts were given. 3.  The patient was told to return if becoming worse in any way, if no better in 3 or 4 days, and given some red flag symptoms that would indicate earlier return.  Follow up:  The patient was told to follow up as needed.       Reuben Likes, MD 05/02/12 [phone]

## 2012-05-02 NOTE — ED Notes (Signed)
Reports cotton from Q-tip in left ear canal x 2 days.  Has tried using tweezers to remove it, but has been unsuccessful.

## 2012-05-14 ENCOUNTER — Ambulatory Visit: Payer: PRIVATE HEALTH INSURANCE

## 2012-05-17 ENCOUNTER — Ambulatory Visit: Payer: PRIVATE HEALTH INSURANCE | Admitting: Family Medicine

## 2012-05-17 ENCOUNTER — Ambulatory Visit (INDEPENDENT_AMBULATORY_CARE_PROVIDER_SITE_OTHER): Payer: PRIVATE HEALTH INSURANCE | Admitting: Family Medicine

## 2012-05-17 ENCOUNTER — Encounter: Payer: Self-pay | Admitting: Family Medicine

## 2012-05-17 ENCOUNTER — Other Ambulatory Visit (HOSPITAL_COMMUNITY)
Admission: RE | Admit: 2012-05-17 | Discharge: 2012-05-17 | Disposition: A | Discharge status: Home / Self Care | Payer: PRIVATE HEALTH INSURANCE | Source: Ambulatory Visit | Attending: Family Medicine | Admitting: Family Medicine

## 2012-05-17 VITALS — BP 131/83 | HR 97 | Temp 100.1°F | Ht 69.0 in | Wt 204.3 lb

## 2012-05-17 DIAGNOSIS — N760 Acute vaginitis: Secondary | ICD-10-CM

## 2012-05-17 DIAGNOSIS — Z113 Encounter for screening for infections with a predominantly sexual mode of transmission: Secondary | ICD-10-CM | POA: Insufficient documentation

## 2012-05-17 DIAGNOSIS — R1032 Left lower quadrant pain: Secondary | ICD-10-CM | POA: Insufficient documentation

## 2012-05-17 LAB — CBC WITH DIFFERENTIAL/PLATELET
Eosinophils Relative: 1 % (ref 0–5)
HCT: 38.7 % (ref 36.0–46.0)
Lymphocytes Relative: 33 % (ref 12–46)
Lymphs Abs: 2.8 10*3/uL (ref 0.7–4.0)
MCV: 84.3 fL (ref 78.0–100.0)
Monocytes Absolute: 0.7 10*3/uL (ref 0.1–1.0)
Neutro Abs: 4.9 10*3/uL (ref 1.7–7.7)
Platelets: 281 10*3/uL (ref 150–400)
RBC: 4.59 MIL/uL (ref 3.87–5.11)
WBC: 8.4 10*3/uL (ref 4.0–10.5)

## 2012-05-17 LAB — COMPREHENSIVE METABOLIC PANEL
ALT: 15 U/L (ref 0–35)
Albumin: 4.5 g/dL (ref 3.5–5.2)
CO2: 28 mEq/L (ref 19–32)
Calcium: 9.9 mg/dL (ref 8.4–10.5)
Chloride: 99 mEq/L (ref 96–112)
Creat: 0.53 mg/dL (ref 0.50–1.10)
Potassium: 4.4 mEq/L (ref 3.5–5.3)

## 2012-05-17 LAB — POCT WET PREP (WET MOUNT)

## 2012-05-17 LAB — POCT URINALYSIS DIPSTICK
Bilirubin, UA: NEGATIVE
Blood, UA: NEGATIVE
Glucose, UA: NEGATIVE
Spec Grav, UA: 1.02

## 2012-05-17 LAB — POCT URINE PREGNANCY: Preg Test, Ur: NEGATIVE

## 2012-05-17 NOTE — Assessment & Plan Note (Signed)
Etiology uncertain. Given tenderness and fever, will check CBC, CMET, CT abd/pelvis. Diff dx includes diverticulitis, ovarian abscess/torsion, less likely PID (no cervical motion tenderness) or MSK or IBS. Negative Upreg and UA today making urologic less likely. Advised to f/u in clinic within 3-5 days after CT scan. If develops nausea, emesis, worsened pain or other red flags seek emergency care.

## 2012-05-17 NOTE — Patient Instructions (Addendum)
I am not sure the cause of pain. Will check labs and for infection. Get CT scan to check for infection in abdomen.  Make appointment in 1-2 days with Dr. Cristal Ford or Dr. Aviva Signs for follow up. If you have vomiting, blood in stool or worsened symptoms then go to the ER.   Abdominal Pain (Nonspecific) Your exam might not show the exact reason you have abdominal pain. Since there are many different causes of abdominal pain, another checkup and more tests may be needed. It is very important to follow up for lasting (persistent) or worsening symptoms. A possible cause of abdominal pain in any person who still has his or her appendix is acute appendicitis. Appendicitis is often hard to diagnose. Normal blood tests, urine tests, ultrasound, and CT scans do not completely rule out early appendicitis or other causes of abdominal pain. Sometimes, only the changes that happen over time will allow appendicitis and other causes of abdominal pain to be determined. Other potential problems that may require surgery may also take time to become more apparent. Because of this, it is important that you follow all of the instructions below. HOME CARE INSTRUCTIONS   Rest as much as possible.  Do not eat solid food until your pain is gone.  While adults or children have pain: A diet of water, weak decaffeinated tea, broth or bouillon, gelatin, oral rehydration solutions (ORS), frozen ice pops, or ice chips may be helpful.  When pain is gone in adults or children: Start a light diet (dry toast, crackers, applesauce, or white rice). Increase the diet slowly as long as it does not bother you. Eat no dairy products (including cheese and eggs) and no spicy, fatty, fried, or high-fiber foods.  Use no alcohol, caffeine, or cigarettes.  Take your regular medicines unless your caregiver told you not to.  Take any prescribed medicine as directed.  Only take over-the-counter or prescription medicines for pain, discomfort, or  fever as directed by your caregiver. Do not give aspirin to children. If your caregiver has given you a follow-up appointment, it is very important to keep that appointment. Not keeping the appointment could result in a permanent injury and/or lasting (chronic) pain and/or disability. If there is any problem keeping the appointment, you must call to reschedule.  SEEK IMMEDIATE MEDICAL CARE IF:   Your pain is not gone in 24 hours.  Your pain becomes worse, changes location, or feels different.  You or your child has an oral temperature above 102 F (38.9 C), not controlled by medicine.  Your baby is older than 3 months with a rectal temperature of 102 F (38.9 C) or higher.  Your baby is 30 months old or younger with a rectal temperature of 100.4 F (38 C) or higher.  You have shaking chills.  You keep throwing up (vomiting) or cannot drink liquids.  There is blood in your vomit or you see blood in your bowel movements.  Your bowel movements become dark or black.  You have frequent bowel movements.  Your bowel movements stop (become blocked) or you cannot pass gas.  You have bloody, frequent, or painful urination.  You have yellow discoloration in the skin or whites of the eyes.  Your stomach becomes bloated or bigger.  You have dizziness or fainting.  You have chest or back pain. MAKE SURE YOU:   Understand these instructions.  Will watch your condition.  Will get help right away if you are not doing well or get worse.  Document Released: 04/18/2005 Document Revised: 07/11/2011 Document Reviewed: 03/16/2009 [facility] Patient Information 2013 [place], [place].

## 2012-05-17 NOTE — Progress Notes (Signed)
  Subjective:    Patient ID: Veronica Coleman, female    DOB: Oct 04, 1965, 47 y.o.   MRN: [id number]  HPI  1. Abdominal pain x 3 day. Location LLQ, feels crampy and pressure sensation intermittently. Rated 9/10 when present. Associated with fever, chills. Caused her to miss work today, and she is very concerned about other recent abdominal pains (seen few months ago for RLQ pain). She has been able to eat, but slight decreased appetite.  No history of vaginal discharge, bleeding, diarrhea, constipation, blood in stool/urine, urinary frequency, dysuria, new sexual partners, nausea, emesis.  LMP 2 yrs ago.   No sig surgical history.  Review of Systems See HPI otherwise negative.  reports that she has never smoked. She does not have any smokeless tobacco history on file.     Objective:   Physical Exam  Vitals reviewed. Constitutional: She is oriented to person, place, and time. She appears well-developed and well-nourished. No distress.  HENT:  Head: Normocephalic and atraumatic.  Mouth/Throat: Oropharynx is clear and moist. No oropharyngeal exudate.  Eyes: EOM are normal. Pupils are equal, round, and reactive to light.  Neck: Neck supple. No thyromegaly present.  Cardiovascular: Normal rate, regular rhythm and normal heart sounds.   No murmur heard. Pulmonary/Chest: Effort normal and breath sounds normal. No respiratory distress. She has no wheezes. She has no rales.  Abdominal: Soft. Bowel sounds are normal. She exhibits no distension. There is tenderness. There is guarding. There is no rebound.       LLQ with guarding and moderate TTP. No rebound. No scars  Genitourinary: Uterus normal. Vaginal discharge found.       Slight white discharge. No cervical motion tenderness.  Some tenderness in left pelvic area, but no masses palpated.  Musculoskeletal: She exhibits no edema and no tenderness.  Neurological: She is alert and oriented to person, place, and time. No cranial nerve deficit.    Skin: No rash noted. She is not diaphoretic.  Psychiatric: She has a normal mood and affect.          Assessment & Plan:

## 2012-05-21 ENCOUNTER — Ambulatory Visit
Admission: RE | Admit: 2012-05-21 | Discharge: 2012-05-21 | Disposition: A | Discharge status: Home / Self Care | Payer: PRIVATE HEALTH INSURANCE | Source: Ambulatory Visit | Attending: Family Medicine | Admitting: Family Medicine

## 2012-05-21 ENCOUNTER — Telehealth: Payer: Self-pay | Admitting: Family Medicine

## 2012-05-21 DIAGNOSIS — R1032 Left lower quadrant pain: Secondary | ICD-10-CM

## 2012-05-21 MED ORDER — METRONIDAZOLE 500 MG PO TABS: 500.0000 mg | ORAL_TABLET | Freq: Three times a day (TID) | ORAL | Status: DC

## 2012-05-21 MED ORDER — IOHEXOL 300 MG/ML  SOLN
125.0000 mL | Freq: Once | INTRAMUSCULAR | Status: AC | PRN
  Administered 2012-05-21: 125 mL via INTRAVENOUS

## 2012-05-21 MED ORDER — CIPROFLOXACIN HCL 500 MG PO TABS: 500.0000 mg | ORAL_TABLET | Freq: Two times a day (BID) | ORAL | Status: DC

## 2012-05-21 NOTE — Telephone Encounter (Signed)
Patient states that she is returning phone call from Dr. Cristal Ford

## 2012-05-21 NOTE — Telephone Encounter (Signed)
Called about results CT scan shows uncomplicated diverticulitis, normal labs. Would treat with ciprofloxacin/flagyl if still symptomatic with LLQ pain and no other symptoms such as emesis. Please inform if she calls back can pick up antibiotics at [facility].

## 2012-05-21 NOTE — Telephone Encounter (Signed)
Patient would like to speak to Dr. Cristal Ford to discuss the Dx that the CT Scan showed.

## 2012-05-21 NOTE — Telephone Encounter (Signed)
Returned call to pt. She endorses having low grade temp today and stable LLQ pain. Will treat diverticulitis seen on exam with abx sent to [facility]. Advised her to f/u or seek emergency care if fever, emesis or worsening. She agrees.

## 2012-06-06 ENCOUNTER — Other Ambulatory Visit: Payer: Self-pay | Admitting: Family Medicine

## 2012-08-20 ENCOUNTER — Other Ambulatory Visit: Payer: Self-pay | Admitting: Family Medicine

## 2012-09-17 ENCOUNTER — Encounter: Payer: Self-pay | Admitting: Family Medicine

## 2012-09-17 ENCOUNTER — Ambulatory Visit (INDEPENDENT_AMBULATORY_CARE_PROVIDER_SITE_OTHER): Payer: PRIVATE HEALTH INSURANCE | Admitting: Family Medicine

## 2012-09-17 VITALS — BP 140/83 | HR 92 | Temp 98.7°F | Ht 69.0 in | Wt 207.0 lb

## 2012-09-17 DIAGNOSIS — R51 Headache: Secondary | ICD-10-CM

## 2012-09-17 DIAGNOSIS — R519 Headache, unspecified: Secondary | ICD-10-CM | POA: Insufficient documentation

## 2012-09-17 DIAGNOSIS — M722 Plantar fascial fibromatosis: Secondary | ICD-10-CM

## 2012-09-17 MED ORDER — PROMETHAZINE HCL 25 MG PO TABS: 25.0000 mg | ORAL_TABLET | Freq: Three times a day (TID) | ORAL | Status: DC | PRN

## 2012-09-17 MED ORDER — PROMETHAZINE HCL 25 MG/ML IJ SOLN
50.0000 mg | Freq: Once | INTRAMUSCULAR | Status: AC
  Administered 2012-09-17: 50 mg via INTRAMUSCULAR

## 2012-09-17 MED ORDER — IBUPROFEN 800 MG PO TABS: 800.0000 mg | ORAL_TABLET | Freq: Three times a day (TID) | ORAL | Status: DC | PRN

## 2012-09-17 MED ORDER — DEXAMETHASONE SODIUM PHOSPHATE 10 MG/ML IJ SOLN
10.0000 mg | Freq: Once | INTRAMUSCULAR | Status: AC
  Administered 2012-09-17: 10 mg via INTRAMUSCULAR

## 2012-09-17 MED ORDER — KETOROLAC TROMETHAMINE 60 MG/2ML IM SOLN
60.0000 mg | Freq: Once | INTRAMUSCULAR | Status: AC
  Administered 2012-09-17: 60 mg via INTRAMUSCULAR

## 2012-09-17 NOTE — Patient Instructions (Signed)
It was good to see you today! If you still have a headache tomorrow morning, start taking the ibuprofen and phenergan with breakfast, lunch, or dinner.  If your headache isn't better by mid-afternoon, get an appointment to see [place] on Wednesday.

## 2012-09-17 NOTE — Assessment & Plan Note (Signed)
The patient's headache is most likely a migraine family. Given the lack of neurological findings and time course of the headache, I doubt serious intracranial pathology. Accordingly there is no indication for neuro imaging. Acute treatment is per orders and patient instructions. If the patient continues to have problems with headaches in the future consideration should be made for starting a daily prophylactic medication.

## 2012-09-17 NOTE — Progress Notes (Signed)
Patient ID: Veronica Coleman, female   DOB: 17-Jan-1966, 47 y.o.   MRN: [id number] Subjective: The patient is a 47 y.o. year old female who presents today for headache.  Has been present now for close to a week. It had a gradual onset. The patient cannot pinpoint an exact time when he can. It has been located in the bilateral posterior portion of her head and is throbbing in character. He is not having any visual changes. She has not had any fevers, chills, nausea, vomiting, personality changes, or problems with coordination. She reports that she has had similar headaches in the past although I have not been as bad as this. She presents today because she went to work but was unable to stay do to the pain. She has tried Motrin for her headache and reports that this has not been helpful.  Patient's past medical, social, and family history were reviewed and updated as appropriate. History  Substance Use Topics  . Smoking status: Never Smoker   . Smokeless tobacco: Not on file  . Alcohol Use: No   Objective:  Filed Vitals:   09/17/12 0949  BP: 140/83  Pulse: 92  Temp: 98.7 F (37.1 C)   Gen: NAD HEENT: MMM, EOMI, PERRL, no papiledema Ext: 5+ strength bilateral upper and lower extremities, 2+ reflexes bilaterally Neuro: CN 2-12 intact bilaterally, normal gait  Assessment/Plan:  Please also see individual problems in problem list for problem-specific plans.

## 2012-10-12 ENCOUNTER — Other Ambulatory Visit: Payer: Self-pay

## 2012-10-12 DIAGNOSIS — Z1231 Encounter for screening mammogram for malignant neoplasm of breast: Secondary | ICD-10-CM

## 2012-11-15 ENCOUNTER — Other Ambulatory Visit: Payer: Self-pay | Admitting: Family Medicine

## 2012-11-28 ENCOUNTER — Ambulatory Visit: Payer: PRIVATE HEALTH INSURANCE

## 2012-12-03 ENCOUNTER — Ambulatory Visit
Admission: RE | Admit: 2012-12-03 | Discharge: 2012-12-03 | Disposition: A | Discharge status: Home / Self Care | Payer: PRIVATE HEALTH INSURANCE | Source: Ambulatory Visit

## 2012-12-03 DIAGNOSIS — Z1231 Encounter for screening mammogram for malignant neoplasm of breast: Secondary | ICD-10-CM

## 2012-12-07 ENCOUNTER — Encounter: Payer: PRIVATE HEALTH INSURANCE | Admitting: Family Medicine

## 2012-12-14 ENCOUNTER — Encounter: Payer: Self-pay | Admitting: Family Medicine

## 2012-12-14 ENCOUNTER — Ambulatory Visit (INDEPENDENT_AMBULATORY_CARE_PROVIDER_SITE_OTHER): Payer: PRIVATE HEALTH INSURANCE | Admitting: Family Medicine

## 2012-12-14 VITALS — BP 112/70 | HR 82 | Temp 98.3°F | Ht 66.0 in | Wt 209.2 lb

## 2012-12-14 DIAGNOSIS — N8111 Cystocele, midline: Secondary | ICD-10-CM

## 2012-12-14 DIAGNOSIS — IMO0002 Reserved for concepts with insufficient information to code with codable children: Secondary | ICD-10-CM

## 2012-12-14 DIAGNOSIS — E669 Obesity, unspecified: Secondary | ICD-10-CM

## 2012-12-14 DIAGNOSIS — I10 Essential (primary) hypertension: Secondary | ICD-10-CM

## 2012-12-14 NOTE — Patient Instructions (Addendum)
Cystocele Repair A cystocele is a bulging, drooping hernia or break (rupture) of bladder tissue into the birth canal (vagina). This bulging or rupture occurs on the top front wall of the vagina. CAUSES  Cystocele is associated with weakness of the top front wall of the vagina due to stretching and tearing of the ligaments and muscles in the area. This is often the result of:  Multiple childbirths.  Continuous heavy lifting.  Chronic cough from asthma, emphysema, or smoking.  Being overweight.  Changes from aging.  Previous surgery in the vaginal area.  Menopause with loss of estrogen hormone and weakening of the ligaments and muscles around the bladder. SYMPTOMS   Uncontrolled loss of urine (incontinence) with cough, sneeze, or exercise.  Pelvic pressure.  Frequency or urgency to urinate because of inability to completely empty the bladder.  Bladder infections.  Needing to push on the upper vagina to help yourself pass urine. DIAGNOSIS  A cystocele can be diagnosed by doing a pelvic exam and observing the top of the vagina drooping or bulging into or out of the vagina. TREATMENT  Surgical options:  Cystocele repair is surgery that removes the hernia.  There are also different "sling" operations that may be used. Discuss the different types of surgeries to repair a cystocele with your caregiver. Your caregiver will decide what type of surgery will be best in your case. Nonsurgical options:  Kegel exercises. This helps strengthen and tighten the muscles and tissue in and around the bladder and vagina. This may help with mild cases of cystocele.  A pessary may help the cystocele. A pessary is a plastic or rubber device that lifts the bladder into place. A pessary must be fitted by a doctor.  Tampons or diaphragms that lift the bladder into place are sometimes helpful with a minor or small cystocele.  Estrogen may help with mild cases in menopausal and aging women. LET YOUR  CAREGIVER KNOW ABOUT:   Allergies to food or medicine.  Medicines taken, including vitamins, herbs, eyedrops, over-the-counter medicines, and creams.  Use of steroids (by mouth or creams).  Previous problems with anesthetics or numbing medicines.  History of bleeding problems or blood clots.  Previous surgery.  Other health problems, including diabetes and kidney problems.  Possibility of pregnancy, if this applies. RISKS AND COMPLICATIONS  All surgery is associated with risks.  There are risks with a general anesthesia. You should discuss this with your caregiver.  With spinal or epidural anesthesia, there may be an area that is not numbed, and you could feel pain.  Headache could occur with a spinal or epidural anesthetic.  The catheter you will have after surgery may not work properly or may get blocked and need to be replaced.  Excessive bleeding.  Infection.  Injury to surrounding structures.  Recurrence of the cystocele.  Surgery may not get rid of your symptoms. BEFORE THE PROCEDURE   Do not take aspirin or blood thinners for 1 week prior to surgery, unless instructed otherwise.  Do not eat or drink anything after midnight the night before surgery.  Let your caregiver know if you develop a cold or other infectious problems prior to surgery.  If being admitted the day of surgery, you should be present 1 hour prior to your procedure or as directed by your caregiver.  Plan and arrange for help when you go home from the hospital.  If you smoke, do not smoke for at least 2 weeks before the surgery.  Do not   drink any alcohol for 3 days before the surgery. PROCEDURE  You will be given an anesthetic to prevent you from feeling pain during surgery. This may be a general anesthetic that puts you to sleep, or a spinal or epidural anesthetic. You will be asleep or be numbed through the entire procedure. During cystocele repair, tissue is pulled from the sides and  around the top of the vagina to lift up the hernia. This removes the hernia so that the top of the vagina does not fall into the opening of the vagina. AFTER THE PROCEDURE  After surgery, you will be taken to the recovery room where a nurse will take care of you, checking your breathing, blood pressure, pulse, and your progress. When your caregiver feels you are stable, you will be taken to your room. You will have a drainage tube (Foley catheter) that will drain your bladder for 2 to 7 days or longer, until your bladder is working properly. This catheter is placed prior to surgery to help keep your bladder empty and out of the way during the procedure. After surgery, this will make passing your urine easier. The catheter will be removed when you can easily pass urine without this assistance. You may have gauze packing in the vagina that will be removed 1 to 2 days after the surgery. Usually, you will be given a medicine (antibiotic) that kills germs. You will be given pain medicine as needed. You can usually go home in 3 to 5 days. HOME CARE INSTRUCTIONS   Do not take baths. Take showers until your caregiver informs you otherwise.  Take antibiotics as directed by your caregiver.  Exercise as instructed. Do not perform exercises which increase the pressure inside your belly (abdomen), such as sit-ups or lifting weights, until your caregiver has given permission. Walking exercise is preferred.  Only take over-the-counter or prescription medicines for pain and discomfort as directed by your caregiver.  Do not drink alcohol while taking pain medicine.  Do not lift anything over 5 pounds.  Do not drive until your caregiver gives you permission.  Get plenty of rest and sleep.  Have someone help with your household chores for 1 to 2 weeks.  If you develop constipation, you may take a mild laxative with your caregiver's permission. Eating bran foods and drinking enough water and fluids to keep your  urine clear or pale yellow helps with constipation.  Do not take aspirin. It may cause bleeding.  You may resume normal diet and unstrenuous activities as directed.  Do not douche, use tampons, or engage in intercourse until your surgeon has given permission.  Change bandages (dressings) as directed.  Make and keep all your postoperative appointments. SEEK MEDICAL CARE IF:   You have abnormal vaginal discharge.  You develop a rash.  You are having a reaction to your medicine.  You develop nausea or vomiting. SEEK IMMEDIATE MEDICAL CARE IF:   You have redness, swelling, or increasing pain in the vaginal area.  You notice pus coming from the vagina.  You have a fever.  You notice a bad smell coming from the vagina.  You have increasing abdominal pain.  You have frequent urination or you notice burning during urination.  You notice blood in your urine.  You have excessive vaginal bleeding.  You cannot urinate. MAKE SURE YOU:   Understand these instructions.  Will watch your condition.  Will get help right away if you are not doing well or get worse. Document Released:   04/15/2000 Document Revised: 07/11/2011 Document Reviewed: 07/16/2009 [employer] Patient Information 2014 [employer].  

## 2012-12-24 DIAGNOSIS — IMO0002 Reserved for concepts with insufficient information to code with codable children: Secondary | ICD-10-CM | POA: Insufficient documentation

## 2012-12-24 NOTE — Assessment & Plan Note (Addendum)
Mild. No affecting function. Instructed about Kegel exercises. Discussed other options for therapy (invasive-surgery and non invasive- pessary)  Pt will try to address this again when she is ready to make a decision of what to pursue.

## 2012-12-24 NOTE — Progress Notes (Signed)
Family Medicine Office Visit Note   Subjective:   Patient ID: Veronica Coleman, female  DOB: 1966-03-30, 47 y.o.. MRN: [id number]   Pt that comes today for annual exam. She is interested in losing weight and asks [place] today about a new medication her friend is taking and she would like it to be prescribed to her.   Her other complaint is an "abnormality" on her vaginal area she has noticed for about 3 months. This is not painful or pruriginous. Sometimes is more prominent than others. Does not interfere with sex or micturition.   Her past medical history is significant for HTN. She reports to be compliant with her meds and denies noticeable side effects.  Risks Factors significant for: obesity with BMI 33 % Last lipid profile: 8/13 WNL  Review of Systems:  Pt denies SOB, chest pain, palpitations, headaches, dizziness, numbness or weakness. No changes on urinary or BM habits. No unintentional weigh loss/gain.  Objective:   Physical Exam: Gen:  NAD HEENT: Moist mucous membranes  CV: Regular rate and rhythm, no murmurs rubs or gallops PULM: Clear to auscultation bilaterally. No wheezes/rales/rhonchi ABD: Soft, non tender, non distended, normal bowel sounds EXT: No edema Neuro: Alert and oriented x3. No focalization Vulva and perianal area: Normal  Speculum: Vagina with mild anterior bulging compatible with cystocele.  Bimanual exam: Uterus anteverted, no adnexal masses. No cervical motion tenderness.  Assessment & Plan:

## 2012-12-24 NOTE — Assessment & Plan Note (Signed)
Controlled. No changes on therapy.

## 2012-12-24 NOTE — Assessment & Plan Note (Signed)
BMI 33%. Pt has no tried strict diet and exercise program. I do not think is appropriate pharmacologic therapy for her at this time since risks outweight the benefits. This has been discussed with pt.  For more tailored dietary program we have offered to schedule appointment with our dietitian.

## 2013-03-04 ENCOUNTER — Other Ambulatory Visit: Payer: Self-pay | Admitting: Family Medicine

## 2013-05-01 ENCOUNTER — Ambulatory Visit (INDEPENDENT_AMBULATORY_CARE_PROVIDER_SITE_OTHER): Payer: PRIVATE HEALTH INSURANCE | Admitting: *Deleted

## 2013-05-01 DIAGNOSIS — Z23 Encounter for immunization: Secondary | ICD-10-CM

## 2013-05-06 ENCOUNTER — Ambulatory Visit: Payer: PRIVATE HEALTH INSURANCE

## 2013-05-23 ENCOUNTER — Other Ambulatory Visit: Payer: Self-pay | Admitting: Family Medicine

## 2013-07-11 ENCOUNTER — Other Ambulatory Visit: Payer: Self-pay | Admitting: Family Medicine

## 2013-08-12 ENCOUNTER — Emergency Department (HOSPITAL_COMMUNITY)
Admission: EM | Admit: 2013-08-12 | Discharge: 2013-08-12 | Disposition: A | Discharge status: Home / Self Care | Payer: PRIVATE HEALTH INSURANCE | Source: Home / Self Care | Attending: Emergency Medicine | Admitting: Emergency Medicine

## 2013-08-12 ENCOUNTER — Encounter (HOSPITAL_COMMUNITY): Payer: Self-pay | Admitting: Emergency Medicine

## 2013-08-12 DIAGNOSIS — K2970 Gastritis, unspecified, without bleeding: Secondary | ICD-10-CM

## 2013-08-12 DIAGNOSIS — K2990 Gastroduodenitis, unspecified, without bleeding: Secondary | ICD-10-CM

## 2013-08-12 DIAGNOSIS — G43909 Migraine, unspecified, not intractable, without status migrainosus: Secondary | ICD-10-CM

## 2013-08-12 DIAGNOSIS — G5600 Carpal tunnel syndrome, unspecified upper limb: Secondary | ICD-10-CM

## 2013-08-12 LAB — POCT URINALYSIS DIP (DEVICE)
Bilirubin Urine: NEGATIVE
Glucose, UA: NEGATIVE mg/dL
Hgb urine dipstick: NEGATIVE
Ketones, ur: NEGATIVE mg/dL
NITRITE: NEGATIVE
PH: 7.5 (ref 5.0–8.0)
PROTEIN: NEGATIVE mg/dL
Specific Gravity, Urine: 1.02 (ref 1.005–1.030)
UROBILINOGEN UA: 0.2 mg/dL (ref 0.0–1.0)

## 2013-08-12 LAB — POCT I-STAT, CHEM 8
BUN: 12 mg/dL (ref 6–23)
CALCIUM ION: 1.23 mmol/L (ref 1.12–1.23)
Chloride: 99 mEq/L (ref 96–112)
Creatinine, Ser: 0.8 mg/dL (ref 0.50–1.10)
Glucose, Bld: 92 mg/dL (ref 70–99)
HEMATOCRIT: 46 % (ref 36.0–46.0)
HEMOGLOBIN: 15.6 g/dL — AB (ref 12.0–15.0)
Potassium: 3.8 mEq/L (ref 3.7–5.3)
SODIUM: 143 meq/L (ref 137–147)
TCO2: 31 mmol/L (ref 0–100)

## 2013-08-12 MED ORDER — KETOROLAC TROMETHAMINE 30 MG/ML IJ SOLN
INTRAMUSCULAR | Status: AC
  Filled 2013-08-12: qty 1

## 2013-08-12 MED ORDER — OMEPRAZOLE 20 MG PO CPDR: 20.0000 mg | DELAYED_RELEASE_CAPSULE | Freq: Every day | ORAL | Status: DC

## 2013-08-12 MED ORDER — DEXAMETHASONE SODIUM PHOSPHATE 10 MG/ML IJ SOLN
10.0000 mg | Freq: Once | INTRAMUSCULAR | Status: AC
  Administered 2013-08-12: 10 mg via INTRAMUSCULAR

## 2013-08-12 MED ORDER — DEXAMETHASONE SODIUM PHOSPHATE 10 MG/ML IJ SOLN
INTRAMUSCULAR | Status: AC
  Filled 2013-08-12: qty 1

## 2013-08-12 MED ORDER — DIPHENHYDRAMINE HCL 50 MG/ML IJ SOLN
25.0000 mg | Freq: Once | INTRAMUSCULAR | Status: AC
  Administered 2013-08-12: 25 mg via INTRAMUSCULAR

## 2013-08-12 MED ORDER — KETOROLAC TROMETHAMINE 60 MG/2ML IM SOLN
30.0000 mg | Freq: Once | INTRAMUSCULAR | Status: AC
  Administered 2013-08-12: 30 mg via INTRAMUSCULAR

## 2013-08-12 MED ORDER — DIPHENHYDRAMINE HCL 50 MG/ML IJ SOLN
INTRAMUSCULAR | Status: AC
  Filled 2013-08-12: qty 1

## 2013-08-12 MED ORDER — MELOXICAM 7.5 MG PO TABS: 7.5000 mg | ORAL_TABLET | Freq: Every day | ORAL | Status: DC

## 2013-08-12 NOTE — Discharge Instructions (Signed)
Carpal tunnel syndrome is caused by compression of the median nerve in the wrist.  Most often, inflammation of the tendons that pass through the carpal tunnel and surround the median nerve is the causative factor. ° °Wear your wrist splint as much as possible, especially at night.   ° °The following exercises should be done twice daily: ° °Exercises for Carpal Tunnel Syndrome  °Wrists Exercise 1. °• Make a loose right fist, palm up, and use the left hand to press gently down against the clenched hand. °• Resist the force with the closed right hand for 5 seconds. Be sure to keep the wrist straight. °• Turn the right fist palm down, and press the knuckles against the left open palm for 5 seconds. °• Finally, turn the right palm so the thumb-side of the fist is up, and press down again for 5 seconds. °• Repeat with the left hand.  ° Exercise 2. °• Hold one hand straight up shoulder-high with fingers together and palm facing outward. (The position looks like a shoulder-high salute.) °• With the other hand, bend the hand being exercised backward with the fingers still held together and hold for 5 seconds. °• Spread the fingers and thumb open while the hand is still bent back and hold for 5 seconds. °• Repeat five times for each hand.  ° Exercise 3. (Wrist Circle) °• Hold the second and third fingers up, and close the others. °• Draw five clockwise circles in the air with the two finger tips. °• Draw five more counterclockwise circles. °• Repeat with the other hand.  °Fingers and Hand Exercise 1. °• Clench the fingers of one hand into a fist tightly. °• Release, fanning out the fingers. °• Do this five times. Repeat with the other hand.  ° Exercise 2. °• To exercise the thumb, bend it against the palm beneath the little finger, and hold for 5 seconds. °• Spread the fingers apart, palm up, and hold for 5 seconds. °• Repeat five to 10 times with each hand.  ° Exercise 3. °• Gently pull the thumb out and back and hold for 5  seconds. °• Repeat five to 10 times with each hand.  °Forearms (stretching these muscles will reduce tension in the wrist) • Place the hands together in front of the chest, fingers pointed upward in a prayer-like position. °• Keeping the palms flat together, raise the elbows to stretch the forearm muscles. °• Stretch for 10 seconds. °• Gently shake the hands limp for a few seconds to loosen them. °• Repeat frequently when the hands or arms tire from activity.  °Neck and Shoulders Exercise 1. °• Sit upright and place the right hand on top of the left shoulder. °• Hold that shoulder down, and slowly tip the head down toward the right. °• Keep the face pointed forward, or even turned slightly toward the right. °• Hold this stretch gently for 5 seconds. °• Repeat on the other side.  ° Exercise 2. °• Stand in a relaxed position with the arms at the side. °• Shrug the shoulders up, then squeeze the shoulders back, then stretch the shoulders down, and then press them forward. °• The entire exercise should take about 7 seconds.  ° ° °If you are employed in a job that requires repetitive hand or wrist movement (this includes keyboarding or use of a computer mouse) paying attention to work ergonomics is of utmost importance: ° °Preventing CTS in Keyboard Workers °Altering the way a person performs repetitive   activities may help prevent inflammation in the hand and wrist. Most of the interventions described below have been found to reduce repetitive motion problems in the muscles and tendons of the hand and arm. They may reduce the incidence of carpal tunnel syndrome, although there is no definite proof of this effect. °Replacing old tools with ergonomically designed new ones can be very helpful. °Rest Periods and Avoiding Repetition. Anyone who does repetitive tasks should begin with a short warm-up period, take frequent breaks, and avoid overexertion of the hand and finger muscles whenever possible. Employers should be urged  to vary the tasks and work content of their employees. °Taking multiple "microbreaks" (about 3 minutes each) reduces strain and discomfort without decreasing productivity. Such breaks may include the following: °• Shaking or stretching the limbs °• Leaning back in the chair °• Squeezing the shoulder blades together. °• Taking deep breaths °Good Posture. Good posture is extremely important in preventing carpal tunnel syndrome, particularly for typists and computer users. °• The worker should sit with the spine against the back of the chair with the shoulders relaxed. °• The elbows should rest along the sides of the body, with wrists straight. °• The feet should be firmly on the floor or on a footrest. °• Typing materials should be at eye level so that the neck does not bend over the work. °• Keeping the neck flexible and head upright maintains circulation and nerve function to the arms and hands. One method for finding the correct head position is the "pigeon" movement. Keeping the chin level, glide the head slowly and gently forward and backward in small movements, avoiding neck discomfort. °Good Office Furniture. Poorly designed office furniture is a major contributor to bad posture. Chairs should be adjustable for height, with a supportive backrest. Custom-designed chairs, made for people who do not fit in standard chairs, can be expensive. However, the costs are often offset by the savings in medical expenses that follow injuries related to bad posture. °Voice Recognition Software. For CTS patients who must use a computer frequently, a variety of voice recognition software packages (ViaVoice, Voice Xpress, Dragon NaturallySpeaking, IListen) are now available, enabling virtually hands-free computer use. °Keyboard and Mouse Tips. Anyone using a keyboard and mouse has some options that may help protect the hands. °• The tension of the keys should be adjusted so they can be depressed without excessive force. °• The  hands and wrists should remain in a relaxed position to avoid excessive force on the keyboard. °• A 2003 study suggested that mouse-use poses a higher risk than keyboard use. Replacing the mouse with a trackball device and the standard keyboard with a jointed-type keyboard are helpful substitutions. °• Wrist rests, which fit under most keyboards, can help keep the wrists and fingers in a comfortable position. °• Some people recommend keeping the computer mouse as close to the keyboard and the user's body as possible, to reduce shoulder muscle movement. °• The mouse should be held lightly, with the wrist and forearm relaxed. New mouse supports are also available that relieve stress on the hand and support the wrist. °• Some people cut their mouse pads in half to reduce movement. °Innovative keyboard designs may reduce hand stress: °• Alternative geometry keyboards (Microsoft Natural Keyboard, Apple Adjustable Keyboard) allow the user to adjust and modify hand positions as well as adjust key tension. Most have a split or "slanted" keyboard that places the wrists at an angle. Studies suggest they are useful in promoting a neutral position   stress:   Alternative geometry keyboards (Microsoft Natural Keyboard, Apple Adjustable Keyboard) allow the user to adjust and modify hand positions as well as adjust key tension. Most have a split or "slanted" keyboard that places the wrists at an angle. Studies suggest they are useful in promoting a neutral position for the wrist.   The continuous passive motion (CPM) keyboard lifts and declines gently and automatically every 3 minutes to break tension on the hands and wrist.   A keyless keyboard (orbiTouch) is an innovative device that uses two domes. The typist covers the domes with their hands and slides them into different positions that represent letters. Reducing Force from [employer] The force placed on the fingers, hands, and wrists by a repetitive task is an important contributor to CTS. To alleviate the effect of force on the wrist, tools and tasks should be designed so that the wrist position is the same as it would be if the arms dangled in a relaxed manner at the sides.   No task should require the wrist to deviate from side to side or to remain flexed or highly extended for  long periods.   The handles of hand tools such as screwdrivers, scrapers, paint brushes, and buffers should be designed so that the force of the worker's grip is distributed across the muscle between the base of the thumb and the little finger, not just in the center of the palm.   People who need to hold tools (including pencils and steering wheels) for long periods of time should grip them as loosely as possible.   In order to apply force appropriately, the ability to feel an object is extremely important. Tools with textured handles are helpful.   If possible, people should avoid working at low temperatures, which reduces sensation in hands and fingers.   Power tools and machines should be designed to minimize vibrations.   Wearing thick gloves, when possible, may lessen the shock transmitted to the hands and wrists.      Migraine Headache A migraine headache is an intense, throbbing pain on one or both sides of your head. A migraine can last for 30 minutes to several hours. CAUSES  The exact cause of a migraine headache is not always known. However, a migraine may be caused when nerves in the brain become irritated and release chemicals that cause inflammation. This causes pain. Certain things may also trigger migraines, such as:  Alcohol.  Smoking.  Stress.  Menstruation.  Aged cheeses.  Foods or drinks that contain nitrates, glutamate, aspartame, or tyramine.  Lack of sleep.  Chocolate.  Caffeine.  Hunger.  Physical exertion.  Fatigue.  Medicines used to treat chest pain (nitroglycerine), birth control pills, estrogen, and some blood pressure medicines. SIGNS AND SYMPTOMS  Pain on one or both sides of your head.  Pulsating or throbbing pain.  Severe pain that prevents daily activities.  Pain that is aggravated by any physical activity.  Nausea, vomiting, or both.  Dizziness.  Pain with exposure to bright lights, loud noises, or activity.  General  sensitivity to bright lights, loud noises, or smells. Before you get a migraine, you may get warning signs that a migraine is coming (aura). An aura may include:  Seeing flashing lights.  Seeing bright spots, halos, or zig-zag lines.  Having tunnel vision or blurred vision.  Having feelings of numbness or tingling.  Having trouble talking.  Having muscle weakness. DIAGNOSIS  A migraine headache is often diagnosed based on:  Symptoms.  Physical exam.  A CT scan or MRI of your head. These imaging tests cannot diagnose migraines, but they can help rule out other causes of headaches. TREATMENT Medicines may be given for pain and nausea. Medicines can also be given to help prevent recurrent migraines.  HOME CARE INSTRUCTIONS  Only take over-the-counter or prescription medicines for pain or discomfort as directed by your health care provider. The use of long-term narcotics is not recommended.  Lie down in a dark, quiet room when you have a migraine.  Keep a journal to find out what may trigger your migraine headaches. For example, write down:  What you eat and drink.  How much sleep you get.  Any change to your diet or medicines.  Limit alcohol consumption.  Quit smoking if you smoke.  Get 7 9 hours of sleep, or as recommended by your health care provider.  Limit stress.  Keep lights dim if bright lights bother you and make your migraines worse. SEEK IMMEDIATE MEDICAL CARE IF:   Your migraine becomes severe.  You have a fever.  You have a stiff neck.  You have vision loss.  You have muscular weakness or loss of muscle control.  You start losing your balance or have trouble walking.  You feel faint or pass out.  You have severe symptoms that are different from your first symptoms. MAKE SURE YOU:   Understand these instructions.  Will watch your condition.  Will get help right away if you are not doing well or get worse. Document Released: 04/18/2005  Document Revised: 02/06/2013 Document Reviewed: 12/24/2012 [facility] Patient Information 2014 [place], [place].

## 2013-08-12 NOTE — ED Notes (Signed)
Multiple complaints, epigastric pain, and 4 stools this am, not diarrhea, no nausea, no vomiting.  Patient complains of left head pain, no uri symptoms. This started 3 days ago.  Left forearm and ring and middle finger felt tingly, sharp pain that started yesterday.

## 2013-08-12 NOTE — ED Provider Notes (Signed)
Chief Complaint   Chief Complaint  Patient presents with  . Abdominal Pain  . Headache  . Arm Pain    History of Present Illness   Veronica Coleman is a 48 year old female who presents today with headache, left arm and hand pain, and epigastric pain. The headache is been going on for three days. It is localized on the right side of the cranium. It is rated 9 to 10 over 10 in intensity, and is worse if she lies on the left side. She denies any throbbing nature of the pain. There has been no nausea, vomiting, photophobia, or photophobia. She has had no diplopia, blurry vision, spots, or visual auras. She denies any nasal congestion, rhinorrhea, sore throat, fever, or stiff neck. She has had chills. She denies any neurological symptoms such as paresthesias, weakness, difficulty with speech, or ambulation. She does have a history of migraine headaches in the past.  The left hand pain has been also going on for about 23 days. It involves the palm of the hand, the wrist, and the volar forearm. There is been no numbness or tingling. No swelling. The pain caused her arm to feel weak. She denies any neck pain.  She also has a 2 to 3 day history of epigastric pain radiating toward the right upper quadrant, although notes today the pain has gone away completely. She has had no nausea, vomiting, diarrhea, blood in stools, melena, or history of ulcer disease or gallstones. The patient states she had four normal bowel movements today. She denies any chest pain or shortness of breath and she has had no cardiac history.  Review of Systems   Other than as noted above, the patient denies any of the following symptoms: Systemic:  No fever, chills, photophobia, or stiff neck. Eye:  No blurred vision, or diplopia. ENT:  No nasal congestion, rhinorrhea, sinus pressure or pain, or sore throat.  No jaw claudication. Neuro:  No paresthesias, loss of consciousness, seizure activity, muscle weakness, trouble with  coordination or gait, trouble speaking or swallowing. Psych:  No depression, anxiety or trouble sleeping.  PMFSH   Past medical history, family history, social history, meds, and allergies were reviewed.  She has no medication allergies. She takes medicine for high blood pressure, ibuprofen, and Flonase.  Physical Examination    Vital signs:  BP 147/90  Pulse 83  Temp(Src) 98.5 F (36.9 C) (Oral)  Resp 22  SpO2 100% General:  Alert and oriented.  In no distress. Eye:  Lids and conjunctivas normal.  PERRL,  Full EOMs.  Fundi benign with normal discs and vessels. ENT:  No cranial or facial tenderness to palpation.  TMs and canals clear.  Nasal mucosa was normal and uncongested without any drainage. No intra oral lesions, pharynx clear, mucous membranes moist, dentition normal. Neck:  Supple, full ROM, no tenderness to palpation.  No adenopathy or mass. Lungs: clear to auscultation. Heart: regular rhythm, no gallop or murmur. Abdomen: soft, nontender, no organomegaly or mass. Bowel sounds were normal. Extremities: no swelling, pulses were full. Exam of the left arm reveals no tenderness to palpation and full range of motion of all joints without any discomfort. Neuro:  Alert and orented times 3.  Speech was clear, fluent, and appropriate.  Cranial nerves intact. No pronator drift, muscle strength normal. Finger to nose normal.  DTRs were 2+ and symmetrical.Station and gait were normal.  Romberg's sign was normal.  Able to perform tandem gait well. Psych:  Normal affect.  EKG Results:  Date: 08/12/2013  Rate: 77  Rhythm: normal sinus rhythm  QRS Axis: normal  Intervals: normal  ST/T Wave abnormalities: normal  Conduction Disutrbances:none  Narrative Interpretation: Normal sinus rhythm, minimal voltage criteria for LVH, may be normal variant, borderline EKG, no ischemic changes. Old EKG Reviewed: none available   Course in Urgent Care Center     For the migraine she was given Toradol  30 mg IM, Benadryl 25 mg IM, and Decadron 10 mg IM.  For the wrist and hand pain she was given a wrist splint.  Assessment   The primary encounter diagnosis was Migraine headache. Diagnoses of Carpal tunnel syndrome and Gastritis were also pertinent to this visit.  Plan   1.  Meds:  The following meds were prescribed:   Discharge Medication List as of 08/12/2013  2:41 PM    START taking these medications   Details  meloxicam (MOBIC) 7.5 MG tablet Take 1 tablet (7.5 mg total) by mouth daily., Starting 08/12/2013, Until Discontinued, Normal    omeprazole (PRILOSEC) 20 MG capsule Take 1 capsule (20 mg total) by mouth daily., Starting 08/12/2013, Until Discontinued, Normal        2.  Patient Education/Counseling:  The patient was given appropriate handouts, self care instructions, and instructed in symptomatic relief.   3.  Follow up:  The patient was told to follow up here if no better in 3 to 4 days, or sooner if becoming worse in any way, and given some red flag symptoms such as fever, worsening pain, persistent vomiting, or new neurological symptoms which would prompt immediate return.  Follow up with her primary care physician in 1 to 2 weeks.     Reuben Likesavid C Marvelene Stoneberg, MD 08/12/13 [phone]

## 2013-08-26 ENCOUNTER — Ambulatory Visit: Payer: PRIVATE HEALTH INSURANCE | Admitting: Family Medicine

## 2013-09-23 ENCOUNTER — Other Ambulatory Visit: Payer: Self-pay | Admitting: Family Medicine

## 2013-10-21 ENCOUNTER — Other Ambulatory Visit: Payer: Self-pay

## 2013-10-21 DIAGNOSIS — Z1231 Encounter for screening mammogram for malignant neoplasm of breast: Secondary | ICD-10-CM

## 2013-12-09 ENCOUNTER — Ambulatory Visit: Payer: PRIVATE HEALTH INSURANCE

## 2013-12-16 ENCOUNTER — Ambulatory Visit: Payer: PRIVATE HEALTH INSURANCE

## 2013-12-16 ENCOUNTER — Ambulatory Visit: Payer: PRIVATE HEALTH INSURANCE | Admitting: Family Medicine

## 2013-12-23 ENCOUNTER — Other Ambulatory Visit (HOSPITAL_COMMUNITY)
Admission: RE | Admit: 2013-12-23 | Discharge: 2013-12-23 | Disposition: A | Discharge status: Home / Self Care | Payer: 59 | Source: Ambulatory Visit | Attending: Family Medicine | Admitting: Family Medicine

## 2013-12-23 ENCOUNTER — Ambulatory Visit (INDEPENDENT_AMBULATORY_CARE_PROVIDER_SITE_OTHER): Payer: 59 | Admitting: Family Medicine

## 2013-12-23 ENCOUNTER — Encounter (INDEPENDENT_AMBULATORY_CARE_PROVIDER_SITE_OTHER): Payer: Self-pay

## 2013-12-23 ENCOUNTER — Encounter: Payer: Self-pay | Admitting: Family Medicine

## 2013-12-23 ENCOUNTER — Ambulatory Visit
Admission: RE | Admit: 2013-12-23 | Discharge: 2013-12-23 | Disposition: A | Discharge status: Home / Self Care | Payer: 59 | Source: Ambulatory Visit

## 2013-12-23 VITALS — BP 121/78 | HR 77 | Temp 98.1°F | Ht 66.0 in | Wt 204.0 lb

## 2013-12-23 DIAGNOSIS — Z1151 Encounter for screening for human papillomavirus (HPV): Secondary | ICD-10-CM | POA: Diagnosis present

## 2013-12-23 DIAGNOSIS — Z01419 Encounter for gynecological examination (general) (routine) without abnormal findings: Secondary | ICD-10-CM

## 2013-12-23 DIAGNOSIS — Z124 Encounter for screening for malignant neoplasm of cervix: Secondary | ICD-10-CM

## 2013-12-23 DIAGNOSIS — Z0000 Encounter for general adult medical examination without abnormal findings: Secondary | ICD-10-CM

## 2013-12-23 DIAGNOSIS — Z1231 Encounter for screening mammogram for malignant neoplasm of breast: Secondary | ICD-10-CM

## 2013-12-23 LAB — CBC WITH DIFFERENTIAL/PLATELET
BASOS ABS: 0 10*3/uL (ref 0.0–0.1)
BASOS PCT: 0 % (ref 0–1)
Eosinophils Absolute: 0.2 10*3/uL (ref 0.0–0.7)
Eosinophils Relative: 3 % (ref 0–5)
HEMATOCRIT: 37.5 % (ref 36.0–46.0)
Hemoglobin: 12.6 g/dL (ref 12.0–15.0)
Lymphocytes Relative: 53 % — ABNORMAL HIGH (ref 12–46)
Lymphs Abs: 2.8 10*3/uL (ref 0.7–4.0)
MCH: 28.4 pg (ref 26.0–34.0)
MCHC: 33.6 g/dL (ref 30.0–36.0)
MCV: 84.7 fL (ref 78.0–100.0)
MONO ABS: 0.4 10*3/uL (ref 0.1–1.0)
Monocytes Relative: 8 % (ref 3–12)
NEUTROS ABS: 1.9 10*3/uL (ref 1.7–7.7)
NEUTROS PCT: 36 % — AB (ref 43–77)
PLATELETS: 268 10*3/uL (ref 150–400)
RBC: 4.43 MIL/uL (ref 3.87–5.11)
RDW: 14.3 % (ref 11.5–15.5)
WBC: 5.2 10*3/uL (ref 4.0–10.5)

## 2013-12-23 LAB — POCT GLYCOSYLATED HEMOGLOBIN (HGB A1C): Hemoglobin A1C: 6.2

## 2013-12-23 NOTE — Patient Instructions (Signed)
Veronica Coleman, it was a pleasure seeing you today. Today we talked about your overall health. We talked about your weight and the harms of not losing weight. Your blood pressure is controlled; great work with keeping it in a normal range. Nothing on your physical exam was worrisome. I will get some lab work. Please make an appointment to see Dr. Caroleen Hamman in 6 months regarding your blood pressure.  If you have any questions or concerns, please do not hesitate to call the office at [phone].  Sincerely,  Jacquelin Hawking, MD

## 2013-12-23 NOTE — Progress Notes (Signed)
   Subjective:    Patient ID: Veronica Coleman, female    DOB: 01-06-1966, 48 y.o.   MRN: [id number]  HPI  Patient presents for well adult visit.  Complaints: None  Past Medical History  Diagnosis Date  . Hypertension    No past surgical history on file.  Family History  Problem Relation Age of Onset  . Hypertension Mother   . Asthma Son    History  Substance Use Topics  . Smoking status: Never Smoker   . Smokeless tobacco: Not on file  . Alcohol Use: No   No Known Allergies  Outpatient Encounter Prescriptions as of 12/23/2013  Medication Sig  . fluticasone (FLONASE) 50 MCG/ACT nasal spray 2 sprays by Nasal route daily.    . hydrochlorothiazide (HYDRODIURIL) 25 MG tablet TAKE 1 TABLET BY MOUTH EVERY DAY  . ibuprofen (ADVIL,MOTRIN) 800 MG tablet TAKE 1 TABLET (800 MG TOTAL) BY MOUTH EVERY 8 (EIGHT) HOURS AS NEEDED FOR PAIN.  . [DISCONTINUED] cetirizine (ZYRTEC) 10 MG tablet Take 10 mg by mouth at bedtime.    . [DISCONTINUED] meloxicam (MOBIC) 7.5 MG tablet Take 1 tablet (7.5 mg total) by mouth daily.  . [DISCONTINUED] omeprazole (PRILOSEC) 20 MG capsule Take 1 capsule (20 mg total) by mouth daily.    Review of Systems  Respiratory: Negative for shortness of breath.   Cardiovascular: Negative for chest pain.  All other systems reviewed and are negative.      Objective:  BP 121/78  Pulse 77  Temp(Src) 98.1 F (36.7 C) (Oral)  Ht  (1.676 m)  Wt 204 lb (92.534 kg)  BMI 32.94 kg/m2   Physical Exam  Constitutional: She is oriented to person, place, and time. She appears well-developed and well-nourished.  Eyes: Conjunctivae and EOM are normal. Pupils are equal, round, and reactive to light.  Cardiovascular: Normal rate, regular rhythm and normal heart sounds.   No murmur heard. Pulmonary/Chest: Effort normal and breath sounds normal. No respiratory distress. Right breast exhibits no inverted nipple, no mass, no nipple discharge, no skin change and no tenderness.  Left breast exhibits no inverted nipple, no mass, no nipple discharge and no tenderness. Breasts are symmetrical.  Abdominal: Soft. Bowel sounds are normal. She exhibits no distension.  Genitourinary: Vagina normal and uterus normal. There is no rash on the right labia. There is no rash on the left labia. Cervix exhibits no motion tenderness and no discharge.  Musculoskeletal: Normal range of motion. She exhibits no edema.  Neurological: She is alert and oriented to person, place, and time. She has normal reflexes.  Skin: Skin is warm and dry.       Assessment & Plan:   48 yo well adult visit - mammogram done today - pap w/hpv obtained, follow-up results - CBC, Cmet, A1C, Direct LDL

## 2013-12-24 ENCOUNTER — Other Ambulatory Visit: Payer: Self-pay | Admitting: *Deleted

## 2013-12-24 ENCOUNTER — Encounter: Payer: Self-pay | Admitting: Family Medicine

## 2013-12-24 LAB — CYTOLOGY - PAP

## 2013-12-24 LAB — COMPREHENSIVE METABOLIC PANEL
ALBUMIN: 4.7 g/dL (ref 3.5–5.2)
ALK PHOS: 59 U/L (ref 39–117)
ALT: 22 U/L (ref 0–35)
AST: 23 U/L (ref 0–37)
BUN: 7 mg/dL (ref 6–23)
CO2: 30 mEq/L (ref 19–32)
Calcium: 9.6 mg/dL (ref 8.4–10.5)
Chloride: 102 mEq/L (ref 96–112)
Creat: 0.62 mg/dL (ref 0.50–1.10)
Glucose, Bld: 84 mg/dL (ref 70–99)
POTASSIUM: 4.2 meq/L (ref 3.5–5.3)
Sodium: 141 mEq/L (ref 135–145)
Total Bilirubin: 0.4 mg/dL (ref 0.2–1.2)
Total Protein: 7.2 g/dL (ref 6.0–8.3)

## 2013-12-24 LAB — LDL CHOLESTEROL, DIRECT: Direct LDL: 88 mg/dL

## 2013-12-24 MED ORDER — IBUPROFEN 800 MG PO TABS: ORAL_TABLET | ORAL | Status: DC

## 2013-12-26 ENCOUNTER — Telehealth: Payer: Self-pay | Admitting: *Deleted

## 2013-12-26 NOTE — Telephone Encounter (Signed)
Message copied by Tanna Savoy on Thu Dec 26, 2013  2:30 PM ------      Message from: Jacquelin Hawking A      Created: Tue Dec 24, 2013  6:45 PM       Please inform patient that labs were essentially normal except for A1C. Her A1C was 6.2 which is classified as pre-diabetes. I will send her results by mail but we talked about eating habits, exercise and weight during our visit. This will be very important if she wants to keep her A1C controlled and to prevent diabetes. Pap was negative. Next pap smear in 5 years (not 3). ------

## 2013-12-26 NOTE — Telephone Encounter (Signed)
Done

## 2014-01-09 ENCOUNTER — Encounter: Payer: Self-pay | Admitting: Family Medicine

## 2014-01-09 ENCOUNTER — Ambulatory Visit (INDEPENDENT_AMBULATORY_CARE_PROVIDER_SITE_OTHER): Payer: 59 | Admitting: Family Medicine

## 2014-01-09 VITALS — BP 117/82 | HR 72 | Temp 98.2°F | Wt 203.0 lb

## 2014-01-09 DIAGNOSIS — J069 Acute upper respiratory infection, unspecified: Secondary | ICD-10-CM | POA: Insufficient documentation

## 2014-01-09 DIAGNOSIS — A084 Viral intestinal infection, unspecified: Secondary | ICD-10-CM | POA: Insufficient documentation

## 2014-01-09 DIAGNOSIS — A088 Other specified intestinal infections: Secondary | ICD-10-CM

## 2014-01-09 DIAGNOSIS — J029 Acute pharyngitis, unspecified: Secondary | ICD-10-CM

## 2014-01-09 LAB — POCT RAPID STREP A (OFFICE): Rapid Strep A Screen: NEGATIVE

## 2014-01-09 MED ORDER — BENZOCAINE 20 % MT SOLN: 1.0000 "application " | Freq: Three times a day (TID) | OROMUCOSAL | Status: DC | PRN

## 2014-01-09 NOTE — Progress Notes (Signed)
Patient ID: Veronica Coleman, female   DOB: 03-Apr-1966, 48 y.o.   MRN: [id number]   Subjective:    Patient ID: Veronica Coleman, female    DOB: 05/29/1965, 48 y.o.   MRN: [id number]  HPI  CC: sore throat  # Sore throat:  Started 1 week ago  Pain present at all times, no difficulty with swallowing solids/liquids but it is painful  Developed headache and felt like she had a fever yesterday (took advil with improvement), vomited this morning (did not notice any blood or green, but says she flush it right away without looking closely)  Had diarrhea yesterday, 2 "loose" stools, no blood or dark stool noted  Denies abdominal pain currently but had some after vomiting.  No known sick contacts, works at [employer] ROS: no cough, +nasal congestion, no CP, no SOB  Review of Systems   See HPI for ROS. All other systems reviewed and are negative.  Past medical history, surgical, family, and social history reviewed and updated in the EMR. No new updates were made today. Objective:  BP 117/82  Pulse 72  Temp(Src) 98.2 F (36.8 C) (Oral)  Wt 203 lb (92.08 kg) Vitals reviewed  General: NAD HEENT: PERRL, EOMI. MMM, no appreciable erythema or exudate of posterior pharynx. No lymphadenopathy of neck CV: RRR, normal s1 and s2, no murmurs. 2+ radial pulses bilaterally Resp: CTAB, normal effort Abdomen: soft, nontender, nondistended, no masses or organomegaly, normal bowel sounds  Assessment & Plan:  See Problem List Documentation

## 2014-01-09 NOTE — Patient Instructions (Signed)
The rapid strep test was negative, so you do not have a bacterial infection of the throat called "strep throat"  Your symptoms are due to a viral illness. Antibiotics will not help improve your symptoms, but the following will help you feel better while your body fights the virus.    Hurricaine mouth spray will numb the back of the throat, also using throat lozenges will help  Drink lots of water (Guaifenesin "Mucinex")  Nasal Saline Spray  Congestion:   Nose spray: Afrin (Phenylephrine). DO NOT USE MORE THAN 3 DAYS  Oral: Pseudoephedrine  Sneezing & Runny nose: Antihistamines: Zyrtec, Claritin, Allegra  Pain/Sore throat: Tylenol, Ibuprofen  Cough: Dextromethorphan  Wash your hands often to prevent spreading the virus

## 2014-01-09 NOTE — Assessment & Plan Note (Signed)
Suspect viral illness (URI + GI). No red flags, overall normal exam. Rapid strep negative. Plan: symptomatic treatment, encouraged maintaining hydration, hurricaine spray for sore throat. Note given to remain out of work until 9/14.

## 2014-02-06 ENCOUNTER — Other Ambulatory Visit: Payer: Self-pay | Admitting: *Deleted

## 2014-02-06 MED ORDER — IBUPROFEN 800 MG PO TABS: ORAL_TABLET | ORAL | Status: DC

## 2014-02-27 ENCOUNTER — Emergency Department (INDEPENDENT_AMBULATORY_CARE_PROVIDER_SITE_OTHER)
Admission: EM | Admit: 2014-02-27 | Discharge: 2014-02-27 | Disposition: A | Discharge status: Home / Self Care | Payer: 59 | Source: Home / Self Care | Attending: Family Medicine | Admitting: Family Medicine

## 2014-02-27 ENCOUNTER — Ambulatory Visit (HOSPITAL_COMMUNITY)
Admit: 2014-02-27 | Discharge: 2014-02-27 | Disposition: A | Discharge status: Home / Self Care | Payer: 59 | Source: Ambulatory Visit | Attending: Emergency Medicine | Admitting: Emergency Medicine

## 2014-02-27 ENCOUNTER — Encounter (HOSPITAL_COMMUNITY): Payer: Self-pay | Admitting: Emergency Medicine

## 2014-02-27 DIAGNOSIS — M549 Dorsalgia, unspecified: Secondary | ICD-10-CM | POA: Diagnosis present

## 2014-02-27 DIAGNOSIS — R0789 Other chest pain: Secondary | ICD-10-CM

## 2014-02-27 DIAGNOSIS — K219 Gastro-esophageal reflux disease without esophagitis: Secondary | ICD-10-CM

## 2014-02-27 DIAGNOSIS — G44209 Tension-type headache, unspecified, not intractable: Secondary | ICD-10-CM

## 2014-02-27 LAB — POCT I-STAT, CHEM 8
BUN: 10 mg/dL (ref 6–23)
CALCIUM ION: 1.14 mmol/L (ref 1.12–1.23)
CHLORIDE: 99 meq/L (ref 96–112)
Creatinine, Ser: 0.6 mg/dL (ref 0.50–1.10)
Glucose, Bld: 92 mg/dL (ref 70–99)
HEMATOCRIT: 46 % (ref 36.0–46.0)
Hemoglobin: 15.6 g/dL — ABNORMAL HIGH (ref 12.0–15.0)
POTASSIUM: 3.3 meq/L — AB (ref 3.7–5.3)
Sodium: 139 mEq/L (ref 137–147)
TCO2: 27 mmol/L (ref 0–100)

## 2014-02-27 LAB — POCT H PYLORI SCREEN: H. PYLORI SCREEN, POC: NEGATIVE

## 2014-02-27 MED ORDER — OMEPRAZOLE 20 MG PO CPDR: 20.0000 mg | DELAYED_RELEASE_CAPSULE | Freq: Every day | ORAL | Status: DC

## 2014-02-27 MED ORDER — GI COCKTAIL ~~LOC~~
ORAL | Status: AC
  Filled 2014-02-27: qty 30

## 2014-02-27 MED ORDER — GI COCKTAIL ~~LOC~~
30.0000 mL | Freq: Once | ORAL | Status: AC
  Administered 2014-02-27: 30 mL via ORAL

## 2014-02-27 MED ORDER — BUTALBITAL-APAP-CAFFEINE 50-325-40 MG PO TABS
1.0000 | ORAL_TABLET | Freq: Four times a day (QID) | ORAL | Status: DC | PRN
Start: 2014-02-27 — End: 2014-11-06

## 2014-02-27 MED ORDER — SUCRALFATE 1 GM/10ML PO SUSP: 1.0000 g | Freq: Three times a day (TID) | ORAL | Status: DC

## 2014-02-27 NOTE — ED Notes (Signed)
Patient transferred to radiology at [facility] for [facility] ray

## 2014-02-27 NOTE — Discharge Instructions (Signed)
Food Choices for Gastroesophageal Reflux Disease When you have gastroesophageal reflux disease (GERD), the foods you eat and your eating habits are very important. Choosing the right foods can help ease the discomfort of GERD. WHAT GENERAL GUIDELINES DO I NEED TO FOLLOW?  Choose fruits, vegetables, whole grains, low-fat dairy products, and low-fat meat, fish, and poultry.  Limit fats such as oils, salad dressings, butter, nuts, and avocado.  Keep a food diary to identify foods that cause symptoms.  Avoid foods that cause reflux. These may be different for different people.  Eat frequent small meals instead of three large meals each day.  Eat your meals slowly, in a relaxed setting.  Limit fried foods.  Cook foods using methods other than frying.  Avoid drinking alcohol.  Avoid drinking large amounts of liquids with your meals.  Avoid bending over or lying down until 2-3 hours after eating. WHAT FOODS ARE NOT RECOMMENDED? The following are some foods and drinks that may worsen your symptoms: Vegetables Tomatoes. Tomato juice. Tomato and spaghetti sauce. Chili peppers. Onion and garlic. Horseradish. Fruits Oranges, grapefruit, and lemon (fruit and juice). Meats High-fat meats, fish, and poultry. This includes hot dogs, ribs, ham, sausage, salami, and bacon. Dairy Whole milk and chocolate milk. Sour cream. Cream. Butter. Ice cream. Cream cheese.  Beverages Coffee and tea, with or without caffeine. Carbonated beverages or energy drinks. Condiments Hot sauce. Barbecue sauce.  Sweets/Desserts Chocolate and cocoa. Donuts. Peppermint and spearmint. Fats and Oils High-fat foods, including [place] fries and potato chips. Other Vinegar. Strong spices, such as black pepper, white pepper, red pepper, cayenne, curry powder, cloves, ginger, and chili powder. The items listed above may not be a complete list of foods and beverages to avoid. Contact your dietitian for more  information. Document Released: 04/18/2005 Document Revised: 04/23/2013 Document Reviewed: 02/20/2013 [facility] Patient Information 2015 [place], [place]. This information is not intended to replace advice given to you by your health care provider. Make sure you discuss any questions you have with your health care provider.  Gastroesophageal Reflux Disease, Adult Gastroesophageal reflux disease (GERD) happens when acid from your stomach flows up into the esophagus. When acid comes in contact with the esophagus, the acid causes soreness (inflammation) in the esophagus. Over time, GERD may create small holes (ulcers) in the lining of the esophagus. CAUSES   Increased body weight. This puts pressure on the stomach, making acid rise from the stomach into the esophagus.  Smoking. This increases acid production in the stomach.  Drinking alcohol. This causes decreased pressure in the lower esophageal sphincter (valve or ring of muscle between the esophagus and stomach), allowing acid from the stomach into the esophagus.  Late evening meals and a full stomach. This increases pressure and acid production in the stomach.  A malformed lower esophageal sphincter. Sometimes, no cause is found. SYMPTOMS   Burning pain in the lower part of the mid-chest behind the breastbone and in the mid-stomach area. This may occur twice a week or more often.  Trouble swallowing.  Sore throat.  Dry cough.  Asthma-like symptoms including chest tightness, shortness of breath, or wheezing. DIAGNOSIS  Your caregiver may be able to diagnose GERD based on your symptoms. In some cases, X-rays and other tests may be done to check for complications or to check the condition of your stomach and esophagus. TREATMENT  Your caregiver may recommend over-the-counter or prescription medicines to help decrease acid production. Ask your caregiver before starting or adding any new medicines.  HOME  CARE INSTRUCTIONS   Change the  factors that you can control. Ask your caregiver for guidance concerning weight loss, quitting smoking, and alcohol consumption.  Avoid foods and drinks that make your symptoms worse, such as:  Caffeine or alcoholic drinks.  Chocolate.  Peppermint or mint flavorings.  Garlic and onions.  Spicy foods.  Citrus fruits, such as oranges, lemons, or limes.  Tomato-based foods such as sauce, chili, salsa, and pizza.  Fried and fatty foods.  Avoid lying down for the 3 hours prior to your bedtime or prior to taking a nap.  Eat small, frequent meals instead of large meals.  Wear loose-fitting clothing. Do not wear anything tight around your waist that causes pressure on your stomach.  Raise the head of your bed 6 to 8 inches with wood blocks to help you sleep. Extra pillows will not help.  Only take over-the-counter or prescription medicines for pain, discomfort, or fever as directed by your caregiver.  Do not take aspirin, ibuprofen, or other nonsteroidal anti-inflammatory drugs (NSAIDs). SEEK IMMEDIATE MEDICAL CARE IF:   You have pain in your arms, neck, jaw, teeth, or back.  Your pain increases or changes in intensity or duration.  You develop nausea, vomiting, or sweating (diaphoresis).  You develop shortness of breath, or you faint.  Your vomit is green, yellow, black, or looks like coffee grounds or blood.  Your stool is red, bloody, or black. These symptoms could be signs of other problems, such as heart disease, gastric bleeding, or esophageal bleeding. MAKE SURE YOU:   Understand these instructions.  Will watch your condition.  Will get help right away if you are not doing well or get worse. Document Released: 01/26/2005 Document Revised: 07/11/2011 Document Reviewed: 11/05/2010 [facility] Patient Information 2015 [place]. This information is not intended to replace advice given to you by your health care provider. Make sure you discuss any questions you have  with your health care provider.  General Headache Without Cause A headache is pain or discomfort felt around the head or neck area. The specific cause of a headache may not be found. There are many causes and types of headaches. A few common ones are:  Tension headaches.  Migraine headaches.  Cluster headaches.  Chronic daily headaches. HOME CARE INSTRUCTIONS   Keep all follow-up appointments with your caregiver or any specialist referral.  Only take over-the-counter or prescription medicines for pain or discomfort as directed by your caregiver.  Lie down in a dark, quiet room when you have a headache.  Keep a headache journal to find out what may trigger your migraine headaches. For example, write down:  What you eat and drink.  How much sleep you get.  Any change to your diet or medicines.  Try massage or other relaxation techniques.  Put ice packs or heat on the head and neck. Use these 3 to 4 times per day for 15 to 20 minutes each time, or as needed.  Limit stress.  Sit up straight, and do not tense your muscles.  Quit smoking if you smoke.  Limit alcohol use.  Decrease the amount of caffeine you drink, or stop drinking caffeine.  Eat and sleep on a regular schedule.  Get 7 to 9 hours of sleep, or as recommended by your caregiver.  Keep lights dim if bright lights bother you and make your headaches worse. SEEK MEDICAL CARE IF:   You have problems with the medicines you were prescribed.  Your medicines are not working.  You have a change from the usual headache.  You have nausea or vomiting. SEEK IMMEDIATE MEDICAL CARE IF:   Your headache becomes severe.  You have a fever.  You have a stiff neck.  You have loss of vision.  You have muscular weakness or loss of muscle control.  You start losing your balance or have trouble walking.  You feel faint or pass out.  You have severe symptoms that are different from your first symptoms. MAKE SURE  YOU:   Understand these instructions.  Will watch your condition.  Will get help right away if you are not doing well or get worse. Document Released: 04/18/2005 Document Revised: 07/11/2011 Document Reviewed: 05/04/2011 [facility] Patient Information 2015 [place], [place]. This information is not intended to replace advice given to you by your health care provider. Make sure you discuss any questions you have with your health care provider.

## 2014-02-27 NOTE — ED Provider Notes (Signed)
CSN: [id number]     Arrival date & time 02/27/14  1142 History   First MD Initiated Contact with Patient 02/27/14 1229     No chief complaint on file.  (Consider location/radiation/quality/duration/timing/severity/associated sxs/prior Treatment) HPI      patient is a poor historian, she laughs and shakes her head and says "I don't know" to every question, she has to be encouraged excessively to answer every question.   48 year old female with no significant past medical history presents complaining of upper mid back pain, heartburn, and headache. This started this morning when she was at work. The heartburn and the pain in her upper mid back started at about the same time. She describes a heartburn as feeling like indigestion. Denies any chest pain, shortness of breath, NVD. The pain is in her upper mid back between her shoulder blades. She denies any injury or any heavy lifting. Headache is all over her entire head. She tried taking ibuprofen but that did not help. She has a history of having all these symptoms before but never all the same time. She rates her pain as 4 out of 10 in severity. No recent travel or sick contacts. No extremity numbness or weakness  Past Medical History  Diagnosis Date  . Hypertension    History reviewed. No pertinent past surgical history. Family History  Problem Relation Age of Onset  . Hypertension Mother   . Asthma Son    History  Substance Use Topics  . Smoking status: Never Smoker   . Smokeless tobacco: Not on file  . Alcohol Use: No   OB History   Grav Para Term Preterm Abortions TAB SAB Ect Mult Living                 Review of Systems  Gastrointestinal: Negative for nausea, vomiting and diarrhea.       Heartburn  Musculoskeletal: Positive for back pain.  Neurological: Positive for headaches. Negative for dizziness, speech difficulty, weakness and numbness.  All other systems reviewed and are negative.   Allergies  Review of patient's  allergies indicates no known allergies.  Home Medications   Prior to Admission medications   Medication Sig Start Date End Date Taking? Authorizing Provider  hydrochlorothiazide (HYDRODIURIL) 25 MG tablet TAKE 1 TABLET BY MOUTH EVERY DAY   Yes Dayarmys Piloto de Criselda PeachesLa Paz, MD  benzocaine (HURRICAINE) 20 % SOLN Use as directed 1 application in the mouth or throat 3 (three) times daily as needed for mouth pain. 01/09/14   Nani RavensAndrew M Wight, MD  butalbital-acetaminophen-caffeine (FIORICET) [phone] MG per tablet Take 1-2 tablets by mouth every 6 (six) hours as needed for headache. 02/27/14 02/27/15  Adrian BlackwaterZachary H Sheyann Sulton, PA-C  fluticasone (FLONASE) 50 MCG/ACT nasal spray 2 sprays by Nasal route daily.      Historical Provider, MD  ibuprofen (ADVIL,MOTRIN) 800 MG tablet TAKE 1 TABLET (800 MG TOTAL) BY MOUTH EVERY 8 (EIGHT) HOURS AS NEEDED FOR PAIN. 02/06/14   Grayson N Rumley, DO  omeprazole (PRILOSEC) 20 MG capsule Take 1 capsule (20 mg total) by mouth daily. 02/27/14   Adrian BlackwaterZachary H Tyshika Baldridge, PA-C  sucralfate (CARAFATE) 1 GM/10ML suspension Take 10 mLs (1 g total) by mouth 4 (four) times daily -  with meals and at bedtime. 02/27/14   Adrian BlackwaterZachary H Jalani Cullifer, PA-C   BP 149/53  Pulse 90  Temp(Src) 99.3 F (37.4 C) (Oral)  Resp 16  SpO2 99% Physical Exam  Nursing note and vitals reviewed. Constitutional: She is oriented to  person, place, and time. Vital signs are normal. She appears well-developed and well-nourished. No distress.  HENT:  Head: Normocephalic and atraumatic.  Eyes: Conjunctivae and EOM are normal. Pupils are equal, round, and reactive to light.  Neck: Normal range of motion. Neck supple.  Cardiovascular: Normal rate and regular rhythm.   Murmur heard.  Decrescendo systolic murmur is present with a grade of 1/6  Pulses:      Radial pulses are 2+ on the right side, and 2+ on the left side.  Early systolic decrescendo murmur  Pulmonary/Chest: Effort normal and breath sounds normal. No respiratory  distress.  Abdominal: Soft. Normal appearance and bowel sounds are normal. She exhibits no pulsatile midline mass and no mass. There is no tenderness. There is no rigidity, no rebound, no guarding, no CVA tenderness, no tenderness at McBurney's point and negative Murphy's sign.  Musculoskeletal:       Thoracic back: Normal.       Lumbar back: Normal.  Neurological: She is alert and oriented to person, place, and time. She has normal strength and normal reflexes. No cranial nerve deficit. She exhibits normal muscle tone. She displays a negative Romberg sign. Coordination and gait normal. GCS eye subscore is 4. GCS verbal subscore is 5. GCS motor subscore is 6.  Skin: Skin is warm and dry. No rash noted. She is not diaphoretic.  Psychiatric: She has a normal mood and affect. Judgment normal.    ED Course  Procedures (including critical care time) Labs Review Labs Reviewed  POCT I-STAT, CHEM 8 - Abnormal; Notable for the following:    Potassium 3.3 (*)    Hemoglobin 15.6 (*)    All other components within normal limits  POCT H PYLORI SCREEN    Imaging Review Dg Chest 2 View  02/27/2014   CLINICAL DATA:  Chest discomfort, upper back pain for 2 days  EXAM: CHEST  2 VIEW  COMPARISON:  Chest x-ray of 10/04/2006  FINDINGS: No active infiltrate or effusion is seen. Mediastinal and hilar contours are unchanged. The heart is within upper limits normal. There are degenerative changes in the mid to lower thoracic spine.  IMPRESSION: No active cardiopulmonary disease.   Electronically Signed   By: Dwyane DeePaul  Barry M.D.   On: 02/27/2014 13:51     MDM   1. Gastroesophageal reflux disease without esophagitis   2. Chest discomfort   3. Tension headache    Exam is normal. The workup is negative. She had significant relief with GI cocktail. Discharge with prescriptions for GERD/gastritis as well as headache medicine. She should follow-up if no improvement in a few days.   Meds ordered this encounter   Medications  . gi cocktail (Maalox,Lidocaine,Donnatal)    Sig:   . omeprazole (PRILOSEC) 20 MG capsule    Sig: Take 1 capsule (20 mg total) by mouth daily.    Dispense:  30 capsule    Refill:  1    Order Specific Question:  Supervising Provider    Answer:  Clementeen GrahamOREY, EVAN, S [id number]]  . sucralfate (CARAFATE) 1 GM/10ML suspension    Sig: Take 10 mLs (1 g total) by mouth 4 (four) times daily -  with meals and at bedtime.    Dispense:  420 mL    Refill:  0    Order Specific Question:  Supervising Provider    Answer:  Clementeen GrahamOREY, EVAN, S [id number]]  . butalbital-acetaminophen-caffeine (FIORICET) 50-325-40 MG per tablet    Sig: Take 1-2 tablets by mouth every 6 (  six) hours as needed for headache.    Dispense:  20 tablet    Refill:  0    Order Specific Question:  Supervising Provider    Answer:  Clementeen Graham, Kathie Rhodes [[id number]]       Graylon Good, PA-C 02/27/14 [phone]

## 2014-03-03 ENCOUNTER — Encounter: Payer: Self-pay | Admitting: Family Medicine

## 2014-03-03 ENCOUNTER — Ambulatory Visit (INDEPENDENT_AMBULATORY_CARE_PROVIDER_SITE_OTHER): Payer: 59 | Admitting: Family Medicine

## 2014-03-03 VITALS — BP 134/93 | HR 88 | Temp 98.6°F | Ht 66.0 in | Wt 198.0 lb

## 2014-03-03 DIAGNOSIS — B349 Viral infection, unspecified: Secondary | ICD-10-CM

## 2014-03-03 NOTE — Progress Notes (Signed)
   Subjective:    Patient ID: Veronica Coleman, female    DOB: 01/07/1966, 48 y.o.   MRN: [id number]  HPI  Not Feeling Well Has intermittent mild headache (last yesterday and took a fioricet which kept her from sleeping) Mild stomach upset that the carafate and omperazole has helped a little.  A bit upset that a coworker died unexpectedly last week.  Having recurring thoughts bout her and trouble sleeping relaxing.  No fever or nausea or vomiting or diarrhea or bleeding or suicidal ideation or runny nose  Chief Complaint noted Review of Symptoms - see HPI PMH - Smoking status noted.   Vital Signs reviewed    Review of Systems     Objective:   Physical Exam  Alert no acute distress Psych:  Cognition and judgment appear intact. Alert, communicative  and cooperative with normal attention span and concentration. No apparent delusions, illusions, hallucinations Nose:  External nasal examination shows no deformity or inflammation. Nasal mucosa are pink and moist without lesions or exudates. No septal dislocation or dislocation.No obstruction to airflow. Mouth - no lesions, mucous membranes are moist, no decaying teeth  Ears:  External ear exam shows no significant lesions or deformities.  Otoscopic examination reveals clear canals, tympanic membranes are intact bilaterally without bulging, retraction, inflammation or discharge. Hearing is grossly normal bilaterall Heart - Regular rate and rhythm.  No murmurs, gallops or rubs.    Lungs:  Normal respiratory effort, chest expands symmetrically. Lungs are clear to auscultation, no crackles or wheezes. Abdomen: soft and non-tender without masses, organomegaly or hernias noted.  No guarding or rebound Skin:  Intact without suspicious lesions or rashes Extremities:  No cyanosis, edema, or deformity noted with good range of motion of all major joints.   Neck:  No deformities, thyromegaly, masses, or tenderness noted.   Supple with full range of  motion without pain.       Assessment & Plan:    Viral Syndrome mild worsened due to recent stressor of coworker death.  No signs of significant infection or depression or PTSD or intrabdominal process or intracranial process

## 2014-03-03 NOTE — Patient Instructions (Signed)
Good to see you today!  Thanks for coming in.  Only take the fiorecet if you have a bad headache  Use tylenol or ibuprofen for body aches or headache  If you  Are not feeling better in 2 weeks then come back  All your blood tests were good  To prevent diabetes continue to lose weight   Come back and see Dr Caroleen Hammanumley in Jan

## 2014-04-21 ENCOUNTER — Ambulatory Visit: Payer: 59

## 2014-06-13 ENCOUNTER — Ambulatory Visit (INDEPENDENT_AMBULATORY_CARE_PROVIDER_SITE_OTHER): Payer: 59 | Admitting: Family Medicine

## 2014-06-13 ENCOUNTER — Encounter: Payer: Self-pay | Admitting: Family Medicine

## 2014-06-13 VITALS — Temp 98.6°F | Wt 196.0 lb

## 2014-06-13 DIAGNOSIS — Z23 Encounter for immunization: Secondary | ICD-10-CM

## 2014-06-13 DIAGNOSIS — R350 Frequency of micturition: Secondary | ICD-10-CM

## 2014-06-13 LAB — POCT URINALYSIS DIPSTICK
BILIRUBIN UA: NEGATIVE
Blood, UA: NEGATIVE
GLUCOSE UA: NEGATIVE
Nitrite, UA: NEGATIVE
Protein, UA: NEGATIVE
Spec Grav, UA: 1.02
Urobilinogen, UA: 0.2
pH, UA: 7

## 2014-06-13 LAB — POCT UA - MICROSCOPIC ONLY

## 2014-06-13 NOTE — Progress Notes (Signed)
   Subjective:    Patient ID: Veronica SchwalbeHannatu M Coleman, female    DOB: 12/22/1965, 49 y.o.   MRN: [id number]  HPI  Frequent urination: pelvic pressure, incomplete emptying of bladder, often must stand up to complete urinating.  No urinary incontinence. No dysuria, no hematuria.  Menopause 2011-2012 (pt unsure) Pap: normal 2015  Review of Systems  Constitutional: Negative for chills and diaphoresis.  Respiratory: Negative for cough and shortness of breath.   Cardiovascular: Negative for leg swelling.  Gastrointestinal: Negative for abdominal pain, diarrhea, constipation and anal bleeding.  Endocrine: Negative for cold intolerance and heat intolerance.  Genitourinary: Positive for frequency. Negative for dysuria, urgency, decreased urine volume and difficulty urinating.  Neurological: Positive for headaches (occasional).       Objective:   Physical Exam  Constitutional: She appears well-developed and well-nourished. No distress.  HENT:  Mouth/Throat: Mucous membranes are moist. Pharynx is normal.  Eyes: Conjunctivae and EOM are normal.  Neck: No adenopathy.  Cardiovascular: S2 normal.   Pulmonary/Chest: Effort normal.  Abdominal: She exhibits no distension. There is no tenderness.  Genitourinary:  Mild cystocele  Musculoskeletal: Normal range of motion.  Neurological: She is alert. No cranial nerve deficit. Coordination normal.  Skin: Skin is warm. No rash noted. She is not diaphoretic. No pallor.          Assessment & Plan:  Junelle was seen today for urinary frequency.  Diagnoses and all orders for this visit:  Urinary frequency: 2/2 cystocele Orders: -     POCT urinalysis dipstick -     Urine culture -     POCT UA - Microscopic Only - discussed kegel exercises, to do x10sec x10, 3 sets daily - also discussed trial of tampons, weight loss, exercise  If no improvement in 6 weeks discuss pessary and/HRT  Perry MountACOSTA,Lars Jeziorski ROCIO, MD

## 2014-06-13 NOTE — Patient Instructions (Signed)
Kegel Exercises The goal of Kegel exercises is to isolate and exercise your pelvic floor muscles. These muscles act as a hammock that supports the rectum, vagina, small intestine, and uterus. As the muscles weaken, the hammock sags and these organs are displaced from their normal positions. Kegel exercises can strengthen your pelvic floor muscles and help you to improve bladder and bowel control, improve sexual response, and help reduce many problems and some discomfort during pregnancy. Kegel exercises can be done anywhere and at any time. HOW TO PERFORM KEGEL EXERCISES 1. Locate your pelvic floor muscles. To do this, squeeze (contract) the muscles that you use when you try to stop the flow of urine. You will feel a tightness in the vaginal area (women) and a tight lift in the rectal area (men and women). 2. When you begin, contract your pelvic muscles tight for 2-5 seconds, then relax them for 2-5 seconds. This is one set. Do 4-5 sets with a short pause in between. 3. Contract your pelvic muscles for 8-10 seconds, then relax them for 8-10 seconds. Do 4-5 sets. If you cannot contract your pelvic muscles for 8-10 seconds, try 5-7 seconds and work your way up to 8-10 seconds. Your goal is 4-5 sets of 10 contractions each day. Keep your stomach, buttocks, and legs relaxed during the exercises. Perform sets of both short and long contractions. Vary your positions. Perform these contractions 3-4 times per day. Perform sets while you are:   Lying in bed in the morning.  Standing at lunch.  Sitting in the late afternoon.  Lying in bed at night. You should do 40-50 contractions per day. Do not perform more Kegel exercises per day than recommended. Overexercising can cause muscle fatigue. Continue these exercises for for at least 15-20 weeks or as directed by your caregiver. Document Released: 04/04/2012 Document Reviewed: 04/04/2012 [employer] Patient Information 2015 [employer], [employer]. This information is  not intended to replace advice given to you by your health care provider. Make sure you discuss any questions you have with your health care provider.  

## 2014-06-15 LAB — URINE CULTURE
Colony Count: NO GROWTH
ORGANISM ID, BACTERIA: NO GROWTH

## 2014-09-05 ENCOUNTER — Other Ambulatory Visit: Payer: Self-pay | Admitting: *Deleted

## 2014-09-05 MED ORDER — HYDROCHLOROTHIAZIDE 25 MG PO TABS: 25.0000 mg | ORAL_TABLET | Freq: Every day | ORAL | Status: DC

## 2014-10-27 ENCOUNTER — Other Ambulatory Visit: Payer: Self-pay

## 2014-10-27 DIAGNOSIS — Z1231 Encounter for screening mammogram for malignant neoplasm of breast: Secondary | ICD-10-CM

## 2014-11-06 ENCOUNTER — Ambulatory Visit (INDEPENDENT_AMBULATORY_CARE_PROVIDER_SITE_OTHER): Payer: BLUE CROSS/BLUE SHIELD | Admitting: Family Medicine

## 2014-11-06 ENCOUNTER — Encounter: Payer: Self-pay | Admitting: Family Medicine

## 2014-11-06 VITALS — BP 136/87 | HR 78 | Temp 98.2°F | Ht 66.0 in | Wt 200.9 lb

## 2014-11-06 DIAGNOSIS — N8110 Cystocele, unspecified: Secondary | ICD-10-CM

## 2014-11-06 DIAGNOSIS — IMO0002 Reserved for concepts with insufficient information to code with codable children: Secondary | ICD-10-CM

## 2014-11-06 NOTE — Patient Instructions (Signed)
Thank you so much for coming to visit me today!  Please continue to take your blood pressure medicine. You are up to date on all of your labs. Your up to date on you pap smear. Please go to your mammogram on Monday. You are not yet due for your colonoscopy, but we will need to begin talking about this next year. Please continue to work on your Kegel exercises.   Thanks again! Dr. Caroleen Hammanumley  Kegel Exercises The goal of Kegel exercises is to isolate and exercise your pelvic floor muscles. These muscles act as a hammock that supports the rectum, vagina, small intestine, and uterus. As the muscles weaken, the hammock sags and these organs are displaced from their normal positions. Kegel exercises can strengthen your pelvic floor muscles and help you to improve bladder and bowel control, improve sexual response, and help reduce many problems and some discomfort during pregnancy. Kegel exercises can be done anywhere and at any time. HOW TO PERFORM KEGEL EXERCISES 1. Locate your pelvic floor muscles. To do this, squeeze (contract) the muscles that you use when you try to stop the flow of urine. You will feel a tightness in the vaginal area (women) and a tight lift in the rectal area (men and women). 2. When you begin, contract your pelvic muscles tight for 2-5 seconds, then relax them for 2-5 seconds. This is one set. Do 4-5 sets with a short pause in between. 3. Contract your pelvic muscles for 8-10 seconds, then relax them for 8-10 seconds. Do 4-5 sets. If you cannot contract your pelvic muscles for 8-10 seconds, try 5-7 seconds and work your way up to 8-10 seconds. Your goal is 4-5 sets of 10 contractions each day. Keep your stomach, buttocks, and legs relaxed during the exercises. Perform sets of both short and long contractions. Vary your positions. Perform these contractions 3-4 times per day. Perform sets while you are:   Lying in bed in the morning.  Standing at lunch.  Sitting in the late  afternoon.  Lying in bed at night. You should do 40-50 contractions per day. Do not perform more Kegel exercises per day than recommended. Overexercising can cause muscle fatigue. Continue these exercises for for at least 15-20 weeks or as directed by your caregiver. Document Released: 04/04/2012 Document Reviewed: 04/04/2012 [facility] Patient Information 2015 [place], [place]. This information is not intended to replace advice given to you by your health care provider. Make sure you discuss any questions you have with your health care provider.

## 2014-11-08 NOTE — Progress Notes (Signed)
Subjective:     Patient ID: Veronica SchwalbeHannatu M Coleman, female   DOB: 10/27/1965, 49 y.o.   MRN: [id number]  HPI Veronica Coleman is a 49yo female presenting for annual exam. - Scheduled for mammogram Monday 7/11 - Not yet due to begin colonoscopies - Not yet due for Pap Smear. Last done on 12/23/13 and normal with no detection of HPV. Next due 12/2018 - Last lipid panel 2013 normal - Last BMP 01/2014 normal except for mildly low potassium 3.3 - Last CBC 11/2013 normal - No acute complaints today - Last presented with urinary incontinence. States this has improved significantly since that visit. Has not been working on [employer] exercises.  Review of Systems  Respiratory: Negative for shortness of breath.   Cardiovascular: Negative for chest pain.  Genitourinary: Negative for urgency, frequency and difficulty urinating.  Skin: Negative for rash.       Objective:   Physical Exam  Constitutional: She is oriented to person, place, and time. She appears well-developed and well-nourished. No distress.  HENT:  Head: Normocephalic and atraumatic.  Right Ear: External ear normal.  Left Ear: External ear normal.  Mouth/Throat: No oropharyngeal exudate.  Eyes: Pupils are equal, round, and reactive to light. Right eye exhibits no discharge. Left eye exhibits no discharge.  Cardiovascular: Normal rate and regular rhythm.  Exam reveals no gallop and no friction rub.   No murmur heard. Pulmonary/Chest: Effort normal. No respiratory distress. She has no wheezes.  Abdominal: Soft. Bowel sounds are normal. She exhibits no distension. There is no tenderness.  Musculoskeletal: She exhibits no edema.  Neurological: She is alert and oriented to person, place, and time.  Skin: No rash noted.  Psychiatric: She has a normal mood and affect. Her behavior is normal.      Assessment:     Please refer to Problem List for Assessment.    Plan:     Please refer to Problem List for Plan.

## 2014-11-08 NOTE — Assessment & Plan Note (Signed)
-   Reports improvement in symptoms - Encouraged to keep working on Kegel exercises to prevent further occurances

## 2014-12-19 ENCOUNTER — Emergency Department (HOSPITAL_COMMUNITY)
Admission: EM | Admit: 2014-12-19 | Discharge: 2014-12-19 | Disposition: A | Discharge status: Home / Self Care | Payer: BLUE CROSS/BLUE SHIELD | Source: Home / Self Care | Attending: Emergency Medicine | Admitting: Emergency Medicine

## 2014-12-19 ENCOUNTER — Encounter (HOSPITAL_COMMUNITY): Payer: Self-pay | Admitting: Emergency Medicine

## 2014-12-19 DIAGNOSIS — G43009 Migraine without aura, not intractable, without status migrainosus: Secondary | ICD-10-CM

## 2014-12-19 MED ORDER — KETOROLAC TROMETHAMINE 30 MG/ML IJ SOLN
30.0000 mg | Freq: Once | INTRAMUSCULAR | Status: AC
  Administered 2014-12-19: 30 mg via INTRAMUSCULAR

## 2014-12-19 MED ORDER — DIPHENHYDRAMINE HCL 25 MG PO CAPS
ORAL_CAPSULE | ORAL | Status: AC
  Filled 2014-12-19: qty 2

## 2014-12-19 MED ORDER — DIPHENHYDRAMINE HCL 25 MG PO CAPS
50.0000 mg | ORAL_CAPSULE | Freq: Once | ORAL | Status: AC
  Administered 2014-12-19: 50 mg via ORAL

## 2014-12-19 MED ORDER — METOCLOPRAMIDE HCL 5 MG/ML IJ SOLN
INTRAMUSCULAR | Status: AC
  Filled 2014-12-19: qty 2

## 2014-12-19 MED ORDER — KETOROLAC TROMETHAMINE 30 MG/ML IJ SOLN
INTRAMUSCULAR | Status: AC
  Filled 2014-12-19: qty 1

## 2014-12-19 MED ORDER — DEXAMETHASONE SODIUM PHOSPHATE 10 MG/ML IJ SOLN
INTRAMUSCULAR | Status: AC
  Filled 2014-12-19: qty 1

## 2014-12-19 MED ORDER — DEXAMETHASONE SODIUM PHOSPHATE 10 MG/ML IJ SOLN
10.0000 mg | Freq: Once | INTRAMUSCULAR | Status: AC
  Administered 2014-12-19: 10 mg via INTRAMUSCULAR

## 2014-12-19 MED ORDER — METOCLOPRAMIDE HCL 5 MG/ML IJ SOLN
10.0000 mg | Freq: Once | INTRAMUSCULAR | Status: AC
  Administered 2014-12-19: 10 mg via INTRAMUSCULAR

## 2014-12-19 NOTE — ED Notes (Signed)
C/o constant HA onset 3 days associated w/left arm pain and bilateral flank pain Steady gait... Has been taking Fioricet w/no relief Alert... No no acute distress.

## 2014-12-19 NOTE — Discharge Instructions (Signed)
Migraine Headache °A migraine headache is very bad, throbbing pain on one or both sides of your head. Talk to your doctor about what things may bring on (trigger) your migraine headaches. °HOME CARE °· Only take medicines as told by your doctor. °· Lie down in a dark, quiet room when you have a migraine. °· Keep a journal to find out if certain things bring on migraine headaches. For example, write down: °¨ What you eat and drink. °¨ How much sleep you get. °¨ Any change to your diet or medicines. °· Lessen how much alcohol you drink. °· Quit smoking if you smoke. °· Get enough sleep. °· Lessen any stress in your life. °· Keep lights dim if bright lights bother you or make your migraines worse. °GET HELP RIGHT AWAY IF:  °· Your migraine becomes really bad. °· You have a fever. °· You have a stiff neck. °· You have trouble seeing. °· Your muscles are weak, or you lose muscle control. °· You lose your balance or have trouble walking. °· You feel like you will pass out (faint), or you pass out. °· You have really bad symptoms that are different than your first symptoms. °MAKE SURE YOU:  °· Understand these instructions. °· Will watch your condition. °· Will get help right away if you are not doing well or get worse. °Document Released: 01/26/2008 Document Revised: 07/11/2011 Document Reviewed: 12/24/2012 °ExitCare® Patient Information ©2015 [employer] LLC. This information is not intended to replace advice given to you by your health care provider. Make sure you discuss any questions you have with your health care provider. ° °Recurrent Migraine Headache °A migraine headache is very bad, throbbing pain on one or both sides of your head. Recurrent migraines keep coming back. Talk to your doctor about what things may bring on (trigger) your migraine headaches. °HOME CARE °· Only take medicines as told by your doctor. °· Lie down in a dark, quiet room when you have a migraine. °· Keep a journal to find out if certain  things bring on migraine headaches. For example, write down: °¨ What you eat and drink. °¨ How much sleep you get. °¨ Any change to your diet or medicines. °· Lessen how much alcohol you drink. °· Quit smoking if you smoke. °· Get enough sleep. °· Lessen any stress in your life. °· Keep lights dim if bright lights bother you or make your migraines worse. °GET HELP IF: °· Medicine does not help your migraines. °· Your pain keeps coming back. °· You have a fever. °GET HELP RIGHT AWAY IF:  °· Your migraine becomes really bad. °· You have a stiff neck. °· You have trouble seeing. °· Your muscles are weak, or you lose muscle control. °· You lose your balance or have trouble walking. °· You feel like you will pass out (faint), or you pass out. °· You have really bad symptoms that are different than your first symptoms. °MAKE SURE YOU:  °· Understand these instructions. °· Will watch your condition. °· Will get help right away if you are not doing well or get worse. °Document Released: 01/26/2008 Document Revised: 04/23/2013 Document Reviewed: 12/24/2012 °ExitCare® Patient Information ©2015 [employer], LLC. This information is not intended to replace advice given to you by your health care provider. Make sure you discuss any questions you have with your health care provider. ° °

## 2014-12-19 NOTE — ED Provider Notes (Signed)
CSN: [id number]     Arrival date & time 12/19/14  1612 History   First MD Initiated Contact with Patient 12/19/14 1734     Chief Complaint  Patient presents with  . Headache   (Consider location/radiation/quality/duration/timing/severity/associated sxs/prior Treatment) HPI Comments: 49 year old female African female is complaining of headache. She has a history of migraine headaches and this headache is similar to her previous ones. This headache is a throbbing type pain also with a sharp quality to it located to the left side of her head and forehead. It started approximately 2 days ago and has been constant. She has taken Fioricet and he does not been helpful. She said no nausea, vomiting, diarrhea, abdominal pain, photophobia, problems with vision, speech, hearing, swallowing. Denies neck pain or stiffness. Denies earache, sore throat, fever, chills. She does have occasional phonophobia. Her speech is clear and fluid.  Patient is a 49 y.o. female presenting with migraines. The history is provided by the patient. No language interpreter was used.  Migraine This is a recurrent problem. The current episode started 2 days ago. The problem occurs constantly. The problem has not changed since onset.Associated symptoms include headaches. Pertinent negatives include no chest pain, no abdominal pain and no shortness of breath. Nothing aggravates the symptoms. Nothing relieves the symptoms. She has tried acetaminophen for the symptoms. The treatment provided no relief.    Past Medical History  Diagnosis Date  . Hypertension    History reviewed. No pertinent past surgical history. Family History  Problem Relation Age of Onset  . Hypertension Mother   . Asthma Son    Social History  Substance Use Topics  . Smoking status: Never Smoker   . Smokeless tobacco: None  . Alcohol Use: No   OB History    No data available     Review of Systems  Constitutional: Positive for activity change.  Negative for fever, chills, diaphoresis, appetite change and fatigue.  HENT: Negative for congestion, ear discharge, ear pain, postnasal drip, rhinorrhea, sore throat and trouble swallowing.   Eyes: Negative for pain, redness and visual disturbance.  Respiratory: Negative for cough, choking and shortness of breath.   Cardiovascular: Negative for chest pain, palpitations and leg swelling.  Gastrointestinal: Negative.  Negative for abdominal pain.  Genitourinary: Negative.   Musculoskeletal: Negative.   Skin: Negative.   Neurological: Positive for headaches. Negative for dizziness, tremors, seizures, syncope, facial asymmetry, speech difficulty and weakness.       Occasional numbness that runs down the left arm to the fourth and fifth digits. This has been associated with her migraine headaches in the past.  Psychiatric/Behavioral: Negative.     Allergies  Review of patient's allergies indicates no known allergies.  Home Medications   Prior to Admission medications   Medication Sig Start Date End Date Taking? Authorizing Provider  butalbital-acetaminophen-caffeine (FIORICET WITH CODEINE) 50-325-40-30 MG per capsule Take 1 capsule by mouth every 4 (four) hours as needed for headache.   Yes Historical Provider, MD  hydrochlorothiazide (HYDRODIURIL) 25 MG tablet Take 1 tablet (25 mg total) by mouth daily. 09/05/14  Yes [place] N Rumley, DO  ibuprofen (ADVIL,MOTRIN) 800 MG tablet TAKE 1 TABLET (800 MG TOTAL) BY MOUTH EVERY 8 (EIGHT) HOURS AS NEEDED FOR PAIN. 02/06/14   [place] N Rumley, DO   BP 139/91 mmHg  Pulse 94  Temp(Src) 99.1 F (37.3 C) (Oral)  Resp 16  SpO2 95% Physical Exam  Constitutional: She is oriented to person, place, and time. She appears well-developed  and well-nourished. No distress.  HENT:  Head: Normocephalic.  Mouth/Throat: Oropharynx is clear and moist. No oropharyngeal exudate.  Bilateral TMs are normal Small palate rises symmetrically. Tongue and uvula midline.   Eyes: Conjunctivae and EOM are normal. Pupils are equal, round, and reactive to light.  Neck: Normal range of motion. Neck supple.  Cardiovascular: Normal rate, regular rhythm, normal heart sounds and intact distal pulses.   Pulmonary/Chest: Effort normal and breath sounds normal. No respiratory distress.  Abdominal: Soft. There is no tenderness.  Musculoskeletal: Normal range of motion. She exhibits no edema or tenderness.  Lymphadenopathy:    She has no cervical adenopathy.  Neurological: She is alert and oriented to person, place, and time. She has normal strength. She displays no tremor. No cranial nerve deficit or sensory deficit. She exhibits normal muscle tone. Coordination and gait normal. GCS eye subscore is 4. GCS verbal subscore is 5. GCS motor subscore is 6.  Skin: Skin is warm and dry. No rash noted. She is not diaphoretic.  Psychiatric: She has a normal mood and affect. Her behavior is normal. Thought content normal.  Nursing note and vitals reviewed.   ED Course  Procedures (including critical care time) Labs Review Labs Reviewed - No data to display  Imaging Review No results found.   MDM   1. Migraine without aura and without status migrainosus, not intractable     Toradol 30 mg IM plus Decadron 10 mg IM plus Reglan 10 mg IM plus diphenhydramine 50 mg by mouth Home rest. For any worsening new symptoms or problems go to emergency department. Otherwise, follow-up with your PCP on Monday. Mild relief of headache at 1845h. D/C home to rest.    Hayden Rasmussen, NP 12/19/14 [phone]

## 2014-12-29 ENCOUNTER — Ambulatory Visit
Admission: RE | Admit: 2014-12-29 | Discharge: 2014-12-29 | Disposition: A | Discharge status: Home / Self Care | Payer: BLUE CROSS/BLUE SHIELD | Source: Ambulatory Visit

## 2014-12-29 DIAGNOSIS — Z1231 Encounter for screening mammogram for malignant neoplasm of breast: Secondary | ICD-10-CM

## 2015-02-13 ENCOUNTER — Other Ambulatory Visit: Payer: Self-pay | Admitting: *Deleted

## 2015-02-13 MED ORDER — IBUPROFEN 800 MG PO TABS: ORAL_TABLET | ORAL | Status: DC

## 2015-04-05 ENCOUNTER — Other Ambulatory Visit: Payer: Self-pay | Admitting: Family Medicine

## 2015-06-03 ENCOUNTER — Other Ambulatory Visit: Payer: Self-pay | Admitting: Family Medicine

## 2015-06-04 ENCOUNTER — Other Ambulatory Visit: Payer: Self-pay | Admitting: Family Medicine

## 2015-07-30 ENCOUNTER — Other Ambulatory Visit: Payer: Self-pay | Admitting: Family Medicine

## 2015-10-19 ENCOUNTER — Other Ambulatory Visit: Payer: Self-pay | Admitting: Family Medicine

## 2015-10-19 DIAGNOSIS — Z1231 Encounter for screening mammogram for malignant neoplasm of breast: Secondary | ICD-10-CM

## 2015-11-02 ENCOUNTER — Encounter: Payer: Self-pay | Admitting: Family Medicine

## 2015-11-02 ENCOUNTER — Ambulatory Visit (INDEPENDENT_AMBULATORY_CARE_PROVIDER_SITE_OTHER): Payer: BLUE CROSS/BLUE SHIELD | Admitting: Family Medicine

## 2015-11-02 VITALS — BP 118/72 | HR 91 | Temp 98.8°F | Ht 66.0 in | Wt 204.6 lb

## 2015-11-02 DIAGNOSIS — I10 Essential (primary) hypertension: Secondary | ICD-10-CM | POA: Diagnosis not present

## 2015-11-02 DIAGNOSIS — Z0000 Encounter for general adult medical examination without abnormal findings: Secondary | ICD-10-CM

## 2015-11-02 DIAGNOSIS — R7303 Prediabetes: Secondary | ICD-10-CM | POA: Diagnosis not present

## 2015-11-02 DIAGNOSIS — Z1211 Encounter for screening for malignant neoplasm of colon: Secondary | ICD-10-CM | POA: Diagnosis not present

## 2015-11-02 LAB — POCT GLYCOSYLATED HEMOGLOBIN (HGB A1C): Hemoglobin A1C: 6

## 2015-11-02 MED ORDER — HYDROCHLOROTHIAZIDE 25 MG PO TABS: 25.0000 mg | ORAL_TABLET | Freq: Every day | ORAL | Status: DC

## 2015-11-02 NOTE — Patient Instructions (Signed)
Thank you so much for coming to visit today! We will check several labs today. I will contact you with the results. I have placed a referral to GI for your colonoscopy. They should contact you to set up an appointment. I have refilled your HCTZ. Please follow up in one year.  Dr. Caroleen Hammanumley

## 2015-11-03 LAB — BASIC METABOLIC PANEL WITH GFR
BUN: 11 mg/dL (ref 7–25)
CHLORIDE: 109 mmol/L (ref 98–110)
CO2: 30 mmol/L (ref 20–31)
Calcium: 9.1 mg/dL (ref 8.6–10.4)
Creat: 0.67 mg/dL (ref 0.50–1.05)
GFR, Est African American: >89 mL/min (ref >=60)
GLUCOSE: 91 mg/dL (ref 65–99)
POTASSIUM: 4.1 mmol/L (ref 3.5–5.3)
SODIUM: 143 mmol/L (ref 135–146)

## 2015-11-03 LAB — LIPID PANEL
CHOL/HDL RATIO: 2.2 ratio (ref <=5.0)
Cholesterol: 128 mg/dL (ref 125–200)
HDL: 57 mg/dL (ref >=46)
LDL CALC: 55 mg/dL (ref <130)
TRIGLYCERIDES: 81 mg/dL (ref <150)
VLDL: 16 mg/dL (ref <30)

## 2015-11-04 ENCOUNTER — Other Ambulatory Visit: Payer: Self-pay | Admitting: Family Medicine

## 2015-11-04 DIAGNOSIS — Z0000 Encounter for general adult medical examination without abnormal findings: Secondary | ICD-10-CM | POA: Insufficient documentation

## 2015-11-04 NOTE — Assessment & Plan Note (Signed)
Well-controlled. Continue HCTZ

## 2015-11-04 NOTE — Assessment & Plan Note (Signed)
-   GI consult for Colonoscopy - Patient to schedule Mammogram next month - Next Pap Smear due for 2020 - Will obtain BMP, A1C, Lipid Panel

## 2015-11-04 NOTE — Progress Notes (Signed)
Subjective:     Patient ID: Veronica Coleman, female   DOB: 10/21/1965, 50 y.o.   MRN: [id number]  HPI [name] is a 50yo female presenting today for general check up of Hypertension and Health Maintenance.  # Hypertension: - Well controlled - Denies chest pain, headache, shortness of breath, blurred vision - Currently prescribed HCTZ 25mg   # Health Maintenance: - Last Pap Smear in 2015 negative for HPV. Next due in 2020. - Due for colonoscopy. - Plans to schedule mammogram next month - Last BMP 01/2014 with hypokalemia - A1C elevated to 6.2 in 11/2013 - Last lipid panel in 2013 - Medications reviewed and updated  - Never Smoker  Review of Systems Per HPI. Other systems negative.    Objective:   Physical Exam  Constitutional: She appears well-developed and well-nourished. No distress.  HENT:  Head: Normocephalic and atraumatic.  Cardiovascular: Normal rate and regular rhythm.   No murmur heard. Pulmonary/Chest: Effort normal. No respiratory distress. She has no wheezes.  Abdominal: Soft. She exhibits no distension. There is no tenderness.  Musculoskeletal: She exhibits no edema.  Skin: No rash noted.  Psychiatric: She has a normal mood and affect. Her behavior is normal.      Assessment and Plan:     HYPERTENSION, BENIGN ESSENTIAL - Well controlled - Continue HCTZ  Health care maintenance - GI consult for Colonoscopy - Patient to schedule Mammogram next month - Next Pap Smear due for 2020 - Will obtain BMP, A1C, Lipid Panel

## 2015-11-13 ENCOUNTER — Encounter: Payer: Self-pay | Admitting: Family Medicine

## 2015-11-16 ENCOUNTER — Ambulatory Visit (AMBULATORY_SURGERY_CENTER): Payer: Self-pay

## 2015-11-16 ENCOUNTER — Encounter: Payer: Self-pay | Admitting: Gastroenterology

## 2015-11-16 VITALS — Ht 65.5 in | Wt 202.8 lb

## 2015-11-16 DIAGNOSIS — Z1211 Encounter for screening for malignant neoplasm of colon: Secondary | ICD-10-CM

## 2015-11-16 MED ORDER — SUPREP BOWEL PREP KIT 17.5-3.13-1.6 GM/177ML PO SOLN: 1.0000 | Freq: Once | ORAL | Status: DC

## 2015-11-16 NOTE — Progress Notes (Signed)
No allergies to eggs or soy No past problems with anesthesia No diet meds No home oxygen  emmi unavailable 

## 2015-11-20 ENCOUNTER — Telehealth: Payer: Self-pay | Admitting: Family Medicine

## 2015-11-20 NOTE — Telephone Encounter (Signed)
Pt would like someone to call her about her labs. ep °

## 2015-11-25 ENCOUNTER — Telehealth: Payer: Self-pay | Admitting: Family Medicine

## 2015-11-25 NOTE — Telephone Encounter (Signed)
Attempted to contact concerning results without response. 

## 2015-11-30 ENCOUNTER — Ambulatory Visit (AMBULATORY_SURGERY_CENTER): Payer: BLUE CROSS/BLUE SHIELD | Admitting: Gastroenterology

## 2015-11-30 ENCOUNTER — Encounter: Payer: Self-pay | Admitting: Gastroenterology

## 2015-11-30 VITALS — BP 103/72 | HR 83 | Temp 97.3°F | Resp 17 | Ht 65.0 in | Wt 202.0 lb

## 2015-11-30 DIAGNOSIS — Z1211 Encounter for screening for malignant neoplasm of colon: Secondary | ICD-10-CM | POA: Diagnosis not present

## 2015-11-30 MED ORDER — SODIUM CHLORIDE 0.9 % IV SOLN: 500.0000 mL | INTRAVENOUS | Status: DC

## 2015-11-30 NOTE — Patient Instructions (Addendum)
YOU HAD AN ENDOSCOPIC PROCEDURE TODAY AT THE [facility]:   Refer to the procedure report that was given to you for any specific questions about what was found during the examination.  If the procedure report does not answer your questions, please call your gastroenterologist to clarify.  If you requested that your care partner not be given the details of your procedure findings, then the procedure report has been included in a sealed envelope for you to review at your convenience later.  YOU SHOULD EXPECT: Some feelings of bloating in the abdomen. Passage of more gas than usual.  Walking can help get rid of the air that was put into your GI tract during the procedure and reduce the bloating. If you had a lower endoscopy (such as a colonoscopy or flexible sigmoidoscopy) you may notice spotting of blood in your stool or on the toilet paper. If you underwent a bowel prep for your procedure, you may not have a normal bowel movement for a few days.  Please Note:  You might notice some irritation and congestion in your nose or some drainage.  This is from the oxygen used during your procedure.  There is no need for concern and it should clear up in a day or so.  SYMPTOMS TO REPORT IMMEDIATELY:   Following lower endoscopy (colonoscopy or flexible sigmoidoscopy):  Excessive amounts of blood in the stool  Significant tenderness or worsening of abdominal pains  Swelling of the abdomen that is new, acute  Fever of 100F or higher   Following upper endoscopy (EGD)  Vomiting of blood or coffee ground material  New chest pain or pain under the shoulder blades  Painful or persistently difficult swallowing  New shortness of breath  Fever of 100F or higher  Black, tarry-looking stools  For urgent or emergent issues, a gastroenterologist can be reached at any hour by calling [phone].   DIET: Your first meal following the procedure should be a small meal and then it is ok to progress to  your normal diet. Heavy or fried foods are harder to digest and may make you feel nauseous or bloated.  Likewise, meals heavy in dairy and vegetables can increase bloating.  Drink plenty of fluids but you should avoid alcoholic beverages for 24 hours.  ACTIVITY:  You should plan to take it easy for the rest of today and you should NOT DRIVE or use heavy machinery until tomorrow (because of the sedation medicines used during the test).    FOLLOW UP: Our staff will call the number listed on your records the next business day following your procedure to check on you and address any questions or concerns that you may have regarding the information given to you following your procedure. If we do not reach you, we will leave a message.  However, if you are feeling well and you are not experiencing any problems, there is no need to return our call.  We will assume that you have returned to your regular daily activities without incident.  If any biopsies were taken you will be contacted by phone or by letter within the next 1-3 weeks.  Please call us at [phone] if you have not heard about the biopsies in 3 weeks.    SIGNATURES/CONFIDENTIALITY: You and/or your care partner have signed paperwork which will be entered into your electronic medical record.  These signatures attest to the fact that that the information above on your After Visit Summary has been reviewed   and is understood.  Full responsibility of the confidentiality of this discharge information lies with you and/or your care-partner.    Handouts were given to your care partner on diverticulosis, hemorrhoids,  and a high fiber diet with liberal fluid intake. You may resume your current medications today. Please call if any questions or concerns.

## 2015-11-30 NOTE — Op Note (Signed)
[facility] Patient Name: Veronica Coleman Procedure Date: 11/30/2015 8:37 AM MRN: [id number] Endoscopist: Napoleon Form , MD Age: 50 Referring MD:  Date of Birth: 1966-01-01 Gender: Female Account #: [id number] Procedure:                Colonoscopy Indications:              Screening for colorectal malignant neoplasm, This                            is the patient's first colonoscopy Medicines:                Monitored Anesthesia Care Procedure:                Pre-Anesthesia Assessment:                           - Prior to the procedure, a History and Physical                            was performed, and patient medications and                            allergies were reviewed. The patient's tolerance of                            previous anesthesia was also reviewed. The risks                            and benefits of the procedure and the sedation                            options and risks were discussed with the patient.                            All questions were answered, and informed consent                            was obtained. Prior Anticoagulants: The patient has                            taken no previous anticoagulant or antiplatelet                            agents. ASA Grade Assessment: II - A patient with                            mild systemic disease. After reviewing the risks                            and benefits, the patient was deemed in                            satisfactory condition to undergo the procedure.  After obtaining informed consent, the colonoscope                            was passed under direct vision. Throughout the                            procedure, the patient's blood pressure, pulse, and                            oxygen saturations were monitored continuously. The                            Model CF-HQ190L [id number]) scope was introduced                            through the anus and  advanced to the the cecum,                            identified by appendiceal orifice and ileocecal                            valve. The colonoscopy was performed without                            difficulty. The patient tolerated the procedure                            well. The quality of the bowel preparation was                            good. The ileocecal valve, appendiceal orifice, and                            rectum were photographed. Scope In: 8:44:49 AM Scope Out: 8:55:10 AM Scope Withdrawal Time: 0 hours 7 minutes 28 seconds  Total Procedure Duration: 0 hours 10 minutes 21 seconds  Findings:                 The perianal and digital rectal examinations were                            normal.                           Multiple small and large-mouthed diverticula were                            found in the entire colon. There was no evidence of                            diverticular bleeding.                           Non-bleeding internal hemorrhoids were found during  retroflexion. The hemorrhoids were small.                           The exam was otherwise without abnormality. Complications:            No immediate complications. Estimated Blood Loss:     Estimated blood loss: none. Impression:               - Moderate diverticulosis in the entire examined                            colon. There was no evidence of diverticular                            bleeding.                           - Non-bleeding internal hemorrhoids.                           - The examination was otherwise normal.                           - No specimens collected. Recommendation:           - Patient has a contact number available for                            emergencies. The signs and symptoms of potential                            delayed complications were discussed with the                            patient. Return to normal activities tomorrow.                             Written discharge instructions were provided to the                            patient.                           - Resume previous diet.                           - Continue present medications.                           - Repeat colonoscopy in 10 years for screening                            purposes. Napoleon Form, MD 11/30/2015 8:58:43 AM This report has been signed electronically.

## 2015-11-30 NOTE — Progress Notes (Signed)
Report to PACU, RN, vss, BBS= Clear.  

## 2015-11-30 NOTE — Progress Notes (Signed)
No problems noted in the recovery room. maw 

## 2015-12-01 ENCOUNTER — Telehealth: Payer: Self-pay

## 2015-12-01 NOTE — Telephone Encounter (Signed)
  Follow up Call-  Call back number 11/30/2015  Post procedure Call Back phone  # [phone]  Permission to leave phone message Yes  Some recent data might be hidden     Patient questions:  Do you have a fever, pain , or abdominal swelling? No. Pain Score  0 *  Have you tolerated food without any problems? Yes.    Have you been able to return to your normal activities? Yes.    Do you have any questions about your discharge instructions: Diet   No. Medications  No. Follow up visit  No.  Do you have questions or concerns about your Care? No.  Actions: * If pain score is 4 or above: No action needed, pain <4.

## 2016-01-11 ENCOUNTER — Ambulatory Visit
Admission: RE | Admit: 2016-01-11 | Discharge: 2016-01-11 | Disposition: A | Discharge status: Home / Self Care | Payer: BLUE CROSS/BLUE SHIELD | Source: Ambulatory Visit | Attending: Family Medicine | Admitting: Family Medicine

## 2016-01-11 DIAGNOSIS — Z1231 Encounter for screening mammogram for malignant neoplasm of breast: Secondary | ICD-10-CM | POA: Diagnosis not present

## 2016-01-26 ENCOUNTER — Other Ambulatory Visit: Payer: Self-pay | Admitting: Family Medicine

## 2016-04-08 ENCOUNTER — Other Ambulatory Visit: Payer: Self-pay | Admitting: Family Medicine

## 2016-06-06 ENCOUNTER — Ambulatory Visit (INDEPENDENT_AMBULATORY_CARE_PROVIDER_SITE_OTHER): Payer: BLUE CROSS/BLUE SHIELD | Admitting: *Deleted

## 2016-06-06 DIAGNOSIS — Z23 Encounter for immunization: Secondary | ICD-10-CM

## 2016-06-26 ENCOUNTER — Other Ambulatory Visit: Payer: Self-pay | Admitting: Family Medicine

## 2016-07-15 ENCOUNTER — Ambulatory Visit (HOSPITAL_COMMUNITY)
Admission: EM | Admit: 2016-07-15 | Discharge: 2016-07-15 | Disposition: A | Discharge status: Home / Self Care | Payer: BLUE CROSS/BLUE SHIELD | Attending: Internal Medicine | Admitting: Internal Medicine

## 2016-07-15 ENCOUNTER — Encounter (HOSPITAL_COMMUNITY): Payer: Self-pay | Admitting: Emergency Medicine

## 2016-07-15 DIAGNOSIS — R51 Headache: Secondary | ICD-10-CM

## 2016-07-15 DIAGNOSIS — M79672 Pain in left foot: Secondary | ICD-10-CM | POA: Diagnosis not present

## 2016-07-15 DIAGNOSIS — M791 Myalgia: Secondary | ICD-10-CM | POA: Diagnosis not present

## 2016-07-15 DIAGNOSIS — M25552 Pain in left hip: Secondary | ICD-10-CM | POA: Diagnosis not present

## 2016-07-15 DIAGNOSIS — M7918 Myalgia, other site: Secondary | ICD-10-CM

## 2016-07-15 MED ORDER — NAPROXEN 500 MG PO TABS: 500.0000 mg | ORAL_TABLET | Freq: Two times a day (BID) | ORAL | 0 refills | Status: DC

## 2016-07-15 MED ORDER — KETOROLAC TROMETHAMINE 60 MG/2ML IM SOLN
INTRAMUSCULAR | Status: AC
  Filled 2016-07-15: qty 2

## 2016-07-15 MED ORDER — KETOROLAC TROMETHAMINE 60 MG/2ML IM SOLN
60.0000 mg | Freq: Once | INTRAMUSCULAR | Status: AC
  Administered 2016-07-15: 60 mg via INTRAMUSCULAR

## 2016-07-15 NOTE — ED Triage Notes (Signed)
[facility] a complaint of a headache, left side hip pain and left foot pain x 3 days.

## 2016-07-15 NOTE — Discharge Instructions (Addendum)
Prescription for naproxen (anti inflammatory, pain medicine) sent to [facility] on [address].  Note for work, return to work on Sunday 07/17/16.  Followup with primary care provider for further evaluation if symptoms are not improving.

## 2016-07-15 NOTE — ED Provider Notes (Signed)
[facility]    CSN: [id number] Arrival date & time: 07/15/16  1147     History   Chief Complaint Chief Complaint  Patient presents with  . Headache  . Hip Pain    HPI Veronica Coleman is a 51 y.o. female.  Presents today with left facial pain/congestion, left back pain, pain in feet.  Worse in the last few days.  Went to work today, but had to leave.     HPI  Past Medical History:  Diagnosis Date  . Environmental allergies   . Hypertension     Patient Active Problem List   Diagnosis Date Noted  . Health care maintenance 11/04/2015  . Cystocele 12/24/2012  . Obesity (BMI 30-39.9) 12/20/2011  . Plantar fasciitis 11/01/2011  . Menopause 12/16/2008  . HYPERTENSION, BENIGN ESSENTIAL 09/18/2006  . RHINITIS, ALLERGIC 06/29/2006    History reviewed. No pertinent surgical history.  OB History    No data available       Home Medications    Prior to Admission medications   Medication Sig Start Date End Date Taking? Authorizing Provider  hydrochlorothiazide (HYDRODIURIL) 25 MG tablet Take 1 tablet (25 mg total) by mouth daily. 11/02/15  Yes [place] N Rumley, DO  naproxen (NAPROSYN) 500 MG tablet Take 1 tablet (500 mg total) by mouth 2 (two) times daily. 07/15/16   Eustace MooreLaura W Raynor Calcaterra, MD    Family History Family History  Problem Relation Age of Onset  . Hypertension Mother   . Asthma Son   . Colon cancer Neg Hx     Social History Social History  Substance Use Topics  . Smoking status: Never Smoker  . Smokeless tobacco: Never Used  . Alcohol use No     Allergies   Patient has no known allergies.   Review of Systems Review of Systems  All other systems reviewed and are negative.    Physical Exam Triage Vital Signs ED Triage Vitals  Enc Vitals Group     BP 07/15/16 1228 134/84     Pulse Rate 07/15/16 1228 (!) 104     Resp 07/15/16 1228 20     Temp 07/15/16 1228 99.9 F (37.7 C)     Temp Source 07/15/16 1228 Oral     SpO2 07/15/16 1228 100  %     Weight --      Height --      Pain Score 07/15/16 1227 10     Pain Loc --    Updated Vital Signs BP 134/84 (BP Location: Left Arm)   Pulse (!) 104   Temp 99.9 F (37.7 C) (Oral)   Resp 20   SpO2 100%   Physical Exam  Constitutional: She is oriented to person, place, and time. No distress.  Alert, nicely groomed Looks tired  HENT:  Head: Atraumatic.  B TMs dull, no erythema Mod nasal congestion Throat slightly injected No rash, no hyperalgesia  Eyes:  Conjugate gaze, no eye redness/drainage  Neck: Neck supple.  Cardiovascular: Normal rate and regular rhythm.   Pulmonary/Chest: No respiratory distress. She has no wheezes. She has no rales.  Lungs clear, symmetric breath sounds  Abdominal: She exhibits no distension.  Musculoskeletal: Normal range of motion.  No leg swelling Left lateral mid to lower back discomfort, some paralumbar spasm, mildly tender to palpation Left foot pronated slightly, slightly puffy medial aspect of ankle.  Not warm/red/tender to touch  Neurological: She is alert and oriented to person, place, and time.  Face symmetric,  speech clear/coherent Walked into urgent care independently, able to climb on/off exam table without difficulty  Skin: Skin is warm and dry.  No cyanosis  Nursing note and vitals reviewed.    UC Treatments / Results   Procedures Procedures (including critical care time)  Medications Ordered in UC Medications  ketorolac (TORADOL) injection 60 mg (60 mg Intramuscular Given 07/15/16 1349)     Final Clinical Impressions(s) / UC Diagnoses   Final diagnoses:  Myofascial pain   Prescription for [facility]roxen (anti inflammatory, pain medicine) [facility] [address].  Note for work, return to work on Sunday 07/17/16.  Followup with primary care provider for further evaluation if symptoms are not improving.    New Prescriptions Discharge Medication List as of 07/15/2016  2:02 PM    START taking these medications    Details  naproxen (NAPROSYN) 500 MG tablet Take 1 tablet (500 mg total) by mouth 2 (two) times daily., Starting Fri 07/15/2016, Normal         Eustace Moore, MD 07/15/16 2238

## 2016-09-14 ENCOUNTER — Ambulatory Visit (INDEPENDENT_AMBULATORY_CARE_PROVIDER_SITE_OTHER): Payer: BLUE CROSS/BLUE SHIELD | Admitting: Internal Medicine

## 2016-09-14 ENCOUNTER — Encounter: Payer: Self-pay | Admitting: Internal Medicine

## 2016-09-14 ENCOUNTER — Ambulatory Visit (HOSPITAL_COMMUNITY)
Admission: RE | Admit: 2016-09-14 | Discharge: 2016-09-14 | Disposition: A | Discharge status: Home / Self Care | Payer: BLUE CROSS/BLUE SHIELD | Source: Ambulatory Visit | Attending: Family Medicine | Admitting: Family Medicine

## 2016-09-14 DIAGNOSIS — M25572 Pain in left ankle and joints of left foot: Secondary | ICD-10-CM | POA: Insufficient documentation

## 2016-09-14 MED ORDER — NAPROXEN 500 MG PO TABS: 500.0000 mg | ORAL_TABLET | Freq: Two times a day (BID) | ORAL | 0 refills | Status: DC

## 2016-09-14 NOTE — Progress Notes (Signed)
   Subjective:    Veronica Coleman - 51 y.o. female MRN [id number]  Date of birth: 02/23/1966  HPI  Veronica Coleman is here for SDA for ankle pain.  Ankle Pain: Left ankle in the medial aspect. Has been present for on and off for several years but acutely worsened in the past two weeks. Denies trauma or injury to the area. Endorses swelling over the medial aspect of her left ankle. Denies numbness or tingling. Is able to bear weight but it is painful. Walking and ankle movement makes the pain worse. Comes in wearing a soft ankle brace and has been applying heat at home. Has also been taking Ibuprofen.    -  reports that she has never smoked. She has never used smokeless tobacco. - Review of Systems: Per HPI. - Past Medical History: Patient Active Problem List   Diagnosis Date Noted  . Health care maintenance 11/04/2015  . Cystocele 12/24/2012  . Obesity (BMI 30-39.9) 12/20/2011  . Plantar fasciitis 11/01/2011  . Menopause 12/16/2008  . HYPERTENSION, BENIGN ESSENTIAL 09/18/2006  . RHINITIS, ALLERGIC 06/29/2006   - Medications: reviewed and updated   Objective:   Physical Exam BP 125/85 (BP Location: Left Arm, Patient Position: Sitting, Cuff Size: Normal)   Pulse 82   Temp 99.1 F (37.3 C) (Oral)   Ht 5' 5.5" (1.664 m)   Wt 201 lb 6.4 oz (91.4 kg)   SpO2 99%   BMI 33.00 kg/m  Gen: NAD, alert, cooperative with exam, well-appearing Ankle: Significant edema over the medial aspect of her left ankle below the malleolus. Minimal edema below the left lateral malleolus. No pedal or leg edema. No erythema present. Pes planus is present bilaterally. Area of edema is TTP. No point tenderness over either malleolus. Patient has full ROM at left ankle but pain acutely worsened with eversion. Strength at ankle joint is intact. No increased laxity of left ankle compared to right. Negative left anterior drawer. Patient pronates with walking. Is able to bear weight but gait is antalgic.        Assessment & Plan:   1. Left ankle pain, unspecified chronicity Concern for chronic tendonitis of medial ankle given location of pain and edema. Suspect that patient may have developed this tendonitis from pronation due to pes planus. Will obtain X-ray to ensure no bony abnormality. [facility] as patient may benefit from orthotics. In the meantime, patient to continue with bracing and I have recommended icing. Will do two week trial of NSAIDs.  - naproxen (NAPROSYN) 500 MG tablet; Take 1 tablet (500 mg total) by mouth 2 (two) times daily with a meal.  Dispense: 28 tablet; Refill: 0 - Ambulatory referral to Sports Medicine - DG Ankle Complete Left; Future   Marcy Sirenatherine Wallace, D.O. 09/14/2016, 10:21 AM PGY-2, [facility]

## 2016-09-14 NOTE — Patient Instructions (Signed)
[facility]. Take the Naproxen twice per day for pain and to reduce inflammation. Continue to wear your brace and ice the area multiple times per day. You will get a call about an appointment with Sports medicine.

## 2016-09-15 ENCOUNTER — Telehealth: Payer: Self-pay | Admitting: *Deleted

## 2016-09-15 NOTE — Telephone Encounter (Signed)
-----   Message from Arvilla Marketatherine Lauren Wallace, DO sent at 09/14/2016  1:45 PM EDT ----- Please call patient to let her know that ankle x-ray was negative for any fractures or arthritis.   Marcy Sirenatherine Wallace, D.O. 09/14/2016, 1:45 PM PGY-2, [facility]

## 2016-09-15 NOTE — Telephone Encounter (Signed)
LVM for pt to call back to inform her of below. Zimmerman Rumple, April D, CMA  

## 2016-09-19 NOTE — Telephone Encounter (Signed)
Pt informed. Zimmerman Rumple, Gennie Eisinger D, CMA  

## 2016-09-20 ENCOUNTER — Ambulatory Visit (INDEPENDENT_AMBULATORY_CARE_PROVIDER_SITE_OTHER): Payer: BLUE CROSS/BLUE SHIELD | Admitting: Family Medicine

## 2016-09-20 ENCOUNTER — Encounter: Payer: Self-pay | Admitting: Family Medicine

## 2016-09-20 VITALS — BP 122/82 | HR 79 | Temp 98.8°F | Ht 66.0 in | Wt 240.0 lb

## 2016-09-20 DIAGNOSIS — G8929 Other chronic pain: Secondary | ICD-10-CM | POA: Diagnosis not present

## 2016-09-20 DIAGNOSIS — M25572 Pain in left ankle and joints of left foot: Secondary | ICD-10-CM

## 2016-09-20 NOTE — Patient Instructions (Signed)
Thank you so much for coming to visit today! Please follow up with the [facility] as scheduled. You may continue Naproxen until that time. The place on your back is a Epidermal Cyst. Please let us know if you would like this removed.  Dr. Caroleen Hamman   Epidermal Cyst An epidermal cyst is sometimes called an epidermal inclusion cyst or an infundibular cyst. It is a sac made of skin tissue. The sac contains a substance called keratin. Keratin is a protein that is normally secreted through the hair follicles. When keratin becomes trapped in the top layer of skin (epidermis), it can form an epidermal cyst. Epidermal cysts are usually found on the face, neck, trunk, and genitals. These cysts are usually harmless (benign), and they may not cause symptoms unless they become infected. It is important not to pop epidermal cysts yourself. What are the causes? This condition may be caused by:  A blocked hair follicle.  A hair that curls and re-enters the skin instead of growing straight out of the skin (ingrown hair).  A blocked pore.  Irritated skin.  An injury to the skin.  Certain conditions that are passed along from parent to child (inherited).  Human papillomavirus (HPV). What increases the risk? The following factors may make you more likely to develop an epidermal cyst:  Having acne.  Being overweight.  Wearing tight clothing. What are the signs or symptoms? The only symptom of this condition may be a small, painless lump underneath the skin. When an epidermal cyst becomes infected, symptoms may include:  Redness.  Inflammation.  Tenderness.  Warmth.  Fever.  Keratin draining from the cyst. Keratin may look like a grayish-white, bad-smelling substance.  Pus draining from the cyst. How is this diagnosed? This condition is diagnosed with a physical exam. In some cases, you may have a sample of tissue (biopsy) taken from your cyst to be examined under a microscope  or tested for bacteria. You may be referred to a health care provider who specializes in skin care (dermatologist). How is this treated? In many cases, epidermal cysts go away on their own without treatment. If a cyst becomes infected, treatment may include:  Opening and draining the cyst. After draining, minor surgery to remove the rest of the cyst may be done.  Antibiotic medicine to help prevent infection.  Injections of medicines (steroids) that help to reduce inflammation.  Surgery to remove the cyst. Surgery may be done if:  The cyst becomes large.  The cyst bothers you.  There is a chance that the cyst could turn into cancer. Follow these instructions at home:  Take over-the-counter and prescription medicines only as told by your health care provider.  If you were prescribed an antibiotic, use it as told by your health care provider. Do not stop using the antibiotic even if you start to feel better.  Keep the area around your cyst clean and dry.  Wear loose, dry clothing.  Do not try to pop your cyst.  Avoid touching your cyst.  Check your cyst every day for signs of infection.  Keep all follow-up visits as told by your health care provider. This is important. How is this prevented?  Wear clean, dry, clothing.  Avoid wearing tight clothing.  Keep your skin clean and dry. Shower or take baths every day.  Wash your body with a benzoyl peroxide wash when you shower or bathe. Contact a health care provider if:  Your cyst develops symptoms of infection.  Your condition is not improving or is getting worse.  You develop a cyst that looks different from other cysts you have had.  You have a fever. Get help right away if:  Redness spreads from the cyst into the surrounding area. This information is not intended to replace advice given to you by your health care provider. Make sure you discuss any questions you have with your health care provider. Document  Released: 03/19/2004 Document Revised: 12/16/2015 Document Reviewed: 02/18/2015 Elsevier Interactive Patient Education  2017 [employer]

## 2016-09-21 NOTE — Progress Notes (Signed)
Subjective:     Patient ID: Veronica Coleman, female   DOB: 05/02/1965, 51 y.o.   MRN: [id number]  HPI [name] is a 51yo female presenting today for follow up of left ankle pain. Previously seen on 5/16 for same. Prescribed Naproxen and xrays were obtained, which showed no signs of fracture. Today, reports she has had intermittent left ankle pain and swelling for many years. Has noted improvement in swelling and pain from last office visit. Naproxen is working well to relieve her pain. Reports she has an appointment with [facility] on June 11.  Nonsmoker.  Review of Systems Per HPI    Objective:   Physical Exam  Constitutional: She appears well-developed and well-nourished. No distress.  Cardiovascular:  No murmur heard. Pulmonary/Chest: Effort normal. No respiratory distress. She has no wheezes.  Musculoskeletal:  Pes planus noted bilaterally. Edema noted at last office visit improved per patient. No tenderness over left navicular or base of 5th metatarsal. Mild tenderness with palpation of left medial ankle (patient unable to identify localized point of pain). Antalgic gait with pronation.   Psychiatric: She has a normal mood and affect. Her behavior is normal.       Assessment and Plan:     1. Chronic pain of left ankle Improved. Suspect secondary to pes planus and resulting changes in ankles. Continue Naproxen as needed for pain. [facility]. Anticipate need for custom orthotics or inserts.

## 2016-10-10 ENCOUNTER — Ambulatory Visit (INDEPENDENT_AMBULATORY_CARE_PROVIDER_SITE_OTHER): Payer: BLUE CROSS/BLUE SHIELD | Admitting: Sports Medicine

## 2016-10-10 ENCOUNTER — Encounter: Payer: Self-pay | Admitting: Sports Medicine

## 2016-10-10 VITALS — BP 120/70 | Ht 66.0 in | Wt 204.0 lb

## 2016-10-10 DIAGNOSIS — M76822 Posterior tibial tendinitis, left leg: Secondary | ICD-10-CM

## 2016-10-10 DIAGNOSIS — M722 Plantar fascial fibromatosis: Secondary | ICD-10-CM | POA: Diagnosis not present

## 2016-10-10 NOTE — Progress Notes (Signed)
   Subjective:    Patient ID: Veronica Coleman, female    DOB: 07/15/1965, 51 y.o.   MRN: [id number]  HPI Veronica Coleman is a 51yo female presenting today for left ankle pain. History of chronic intermittent left ankle pain with most recent flare starting in 08/2016. Xray of left ankle in 08/2016 normal. Completed course of Naproxen with some improvement. Today, reports pain has continued to improve however still notes worsening of pain when she is on her feet at work. Denies pain today. Pain when present is located over her medial left ankle around her left medial malleolus. Has had some swelling of her left ankle, but this is improved. Did not bring work shoes to visit today, wearing dress flats. Works at [employer] and is on her feet a lot.  Nonsmoker.  Review of Systems Per HPI    Objective:   Physical Exam  Constitutional: She appears well-developed and well-nourished. She appears distressed.  Cardiovascular: Normal rate.   Pulmonary/Chest: Effort normal. No respiratory distress.  Musculoskeletal:  Pes planus. Mild edema of left ankle. Symmetric ROM bilaterally. No tenderness to palpation over medial or lateral left ankle, no tenderness to palpation over calcaneous, no tenderness over metatarsal bases. Observation from behind with standing shows out-toeing of feet with 3-4toes visible on each foot. Incomplete left calcaneal inversion noted.  US with small tear of left Posterior Tibialis, significant thickening of proximal left Posterior, Tibialis with fluid noted around tendon.    Psychiatric: She has a normal mood and affect. Her behavior is normal.      Assessment & Plan:  Posterior Tibialis Tendinapathy, Left, Chronic, Grade 2 Improved. Small tear of left posterior tibialis with significant thickening noted. Did not bring shoes to have inserts made--[facility]. Arch Strap and Compression Sleeve given to wear in shoes that inserts will not fit in. To return in 4 weeks.

## 2016-11-07 ENCOUNTER — Other Ambulatory Visit: Payer: Self-pay | Admitting: Family Medicine

## 2016-11-07 DIAGNOSIS — Z1231 Encounter for screening mammogram for malignant neoplasm of breast: Secondary | ICD-10-CM

## 2016-11-20 ENCOUNTER — Other Ambulatory Visit: Payer: Self-pay | Admitting: Internal Medicine

## 2016-11-20 DIAGNOSIS — M25572 Pain in left ankle and joints of left foot: Secondary | ICD-10-CM

## 2016-12-05 ENCOUNTER — Ambulatory Visit (INDEPENDENT_AMBULATORY_CARE_PROVIDER_SITE_OTHER): Payer: BLUE CROSS/BLUE SHIELD | Admitting: Internal Medicine

## 2016-12-05 ENCOUNTER — Encounter: Payer: Self-pay | Admitting: Internal Medicine

## 2016-12-05 VITALS — BP 120/70 | HR 77 | Temp 98.5°F | Ht 65.5 in | Wt 203.2 lb

## 2016-12-05 DIAGNOSIS — I10 Essential (primary) hypertension: Secondary | ICD-10-CM | POA: Diagnosis not present

## 2016-12-05 DIAGNOSIS — R7303 Prediabetes: Secondary | ICD-10-CM | POA: Diagnosis not present

## 2016-12-05 LAB — POCT GLYCOSYLATED HEMOGLOBIN (HGB A1C): Hemoglobin A1C: 5.9

## 2016-12-05 NOTE — Progress Notes (Signed)
[facility] Progress Note  Subjective:  Veronica Coleman is a 51 y.o. who presents for a check-up. She has history of HTN and elevated blood sugar.   HTN: Taking HCTZ 25 mg daily. Denies trouble taking this medication.   Obesity: Does report eating a lot of rice and spaghetti. Enjoys vegetables and fruit. Does not exercise. Enjoys working in her garden though.   Elevated blood sugar: A1c of 6.0 last year.   Health Maintenance: Next pap due 12/2018, mammogram 12/2017, colonoscopy 11/29/2025  LDL was 88 11/2013  Social: Married and never smoker.  ROS: No chest pain, no SOB, no changes in vision, no falls, no constipation, no anxiety/depression.   No Known Allergies  Objective: Blood pressure 120/70, pulse 77, temperature 98.5 F (36.9 C), temperature source Oral, height 5' 5.5" (1.664 m), weight 203 lb 3.2 oz (92.2 kg), SpO2 99 %. Constitutional: Obese female, pleasant, in NAD HENT: MMM, normal posterior oropharynx Cardiovascular: RRR, S1, S2, no m/r/g.  Pulmonary/Chest: Effort normal and breath sounds normal. No respiratory distress.  Abdominal: +BS, soft, NT, ND Musculoskeletal: No LE edema Neurological: AOx3, no focal deficits. Skin: Skin is warm and dry. No rash noted.  Psychiatric: Normal mood and affect.  Vitals reviewed  Assessment/Plan: Prediabetes - Remains in prediabetes range at 5.9.  - Counseled on plate method and working on portion control of starchy foods. Recommended regular exercise, for example taking a 30-45 minute walk with her daughter.  - Discussed goal of losing weight to improve CBG control. - Patient prefers not to start medication at this time  Essential hypertension - Well controlled. At goal of <140/90.  - Continue HCTZ 25 mg.   Follow-up in about 3 months to recheck A1c and follow weight.  Veronica GobbleHillary Ticia Virgo, MD [facility], PGY-3

## 2016-12-05 NOTE — Patient Instructions (Signed)
Ms. Veronica Coleman,  Your blood sugar is still somewhat elevated. I recommend trying to lose some weight, incorporating daily exercise, and watching portion sizes.  Please see us back in a few months to recheck sugars.  Best, Dr. Sampson GoonFitzgerald

## 2016-12-07 DIAGNOSIS — R7303 Prediabetes: Secondary | ICD-10-CM | POA: Insufficient documentation

## 2016-12-07 NOTE — Assessment & Plan Note (Signed)
-   Well controlled. At goal of <140/90.  - Continue HCTZ 25 mg.

## 2016-12-07 NOTE — Assessment & Plan Note (Signed)
-   Remains in prediabetes range at 5.9.  - Counseled on plate method and working on portion control of starchy foods. Recommended regular exercise, for example taking a 30-45 minute walk with her daughter.  - Discussed goal of losing weight to improve CBG control. - Patient prefers not to start medication at this time

## 2017-01-16 ENCOUNTER — Ambulatory Visit
Admission: RE | Admit: 2017-01-16 | Discharge: 2017-01-16 | Disposition: A | Discharge status: Home / Self Care | Payer: BLUE CROSS/BLUE SHIELD | Source: Ambulatory Visit | Attending: Family Medicine | Admitting: Family Medicine

## 2017-01-16 DIAGNOSIS — Z1231 Encounter for screening mammogram for malignant neoplasm of breast: Secondary | ICD-10-CM

## 2017-02-03 ENCOUNTER — Ambulatory Visit (HOSPITAL_COMMUNITY)
Admission: RE | Admit: 2017-02-03 | Discharge: 2017-02-03 | Disposition: A | Discharge status: Home / Self Care | Payer: BLUE CROSS/BLUE SHIELD | Source: Ambulatory Visit | Attending: Family Medicine | Admitting: Family Medicine

## 2017-02-03 ENCOUNTER — Encounter: Payer: Self-pay | Admitting: Family Medicine

## 2017-02-03 ENCOUNTER — Ambulatory Visit (INDEPENDENT_AMBULATORY_CARE_PROVIDER_SITE_OTHER): Payer: BLUE CROSS/BLUE SHIELD | Admitting: Family Medicine

## 2017-02-03 VITALS — BP 110/70 | HR 98 | Temp 98.5°F | Ht 65.0 in | Wt 200.0 lb

## 2017-02-03 DIAGNOSIS — Z23 Encounter for immunization: Secondary | ICD-10-CM | POA: Diagnosis not present

## 2017-02-03 DIAGNOSIS — R9431 Abnormal electrocardiogram [ECG] [EKG]: Secondary | ICD-10-CM | POA: Insufficient documentation

## 2017-02-03 DIAGNOSIS — R0789 Other chest pain: Secondary | ICD-10-CM | POA: Diagnosis not present

## 2017-02-03 DIAGNOSIS — R079 Chest pain, unspecified: Secondary | ICD-10-CM | POA: Diagnosis not present

## 2017-02-03 DIAGNOSIS — E669 Obesity, unspecified: Secondary | ICD-10-CM | POA: Diagnosis not present

## 2017-02-03 MED ORDER — OMEPRAZOLE 20 MG PO CPDR: 20.0000 mg | DELAYED_RELEASE_CAPSULE | Freq: Every day | ORAL | 3 refills | Status: DC

## 2017-02-03 NOTE — Progress Notes (Signed)
    Subjective:  Veronica Coleman is a 51 y.o. female who presents to the [facility] ([facility]) today with a chief complaint of heart burn.  Patient describes as heart burn that woke her up last night.   Tried tums/water with no real relief.   Patient could not go back to sleep and discomfort slowly resolved.   She claims associated back pain and head ache but not difficulty breathing.  Pain was significant while it was happening but she has no pain now.   This has not happened for 3-4 years and then only once.  She had no visual changes associated with this.     Objective:  Physical Exam: BP 110/70   Pulse 98   Temp 98.5 F (36.9 C) (Oral)   Ht  (1.651 m)   Wt 200 lb (90.7 kg)   SpO2 98%   BMI 33.28 kg/m   Gen: NAD, obese, resting comfortably although she seems nervous about visit CV: RRR with 1/6 murmur appreciated Pulm: NWOB, CTAB with no crackles, wheezes, or rhonchi GI: Normal bowel sounds present. Soft, Nontender, Nondistended. MSK: no edema, cyanosis, or clubbing noted Skin: warm, dry Neuro: grossly normal, moves all extremities Psych: Normal affect and thought content  No results found for this or any previous visit (from the past 72 hour(s)).   Assessment/Plan:  Obesity (BMI 30-39.9) Discussed benefits of weight loss for avoiding future need for diabetic medications  Chest pain Patient describes as heart burn that woke her up last night.   Tried tums/water with no real relief.   Patient could not go back to sleep and discomfort slowly resolved.   She claims associated back pain and head ache but not difficulty breathing.  Seems to be likely heartburn given lack of activity and positioning with pain but ECG was performed as a precaution.  ECG showed no ST/twave changes and was consistent with 3 years prior.  Will prescribe prilosec   Marthenia Rolling, DO FAMILY MEDICINE RESIDENT - PGY1 02/03/2017 4:40 PM

## 2017-02-03 NOTE — Assessment & Plan Note (Signed)
Discussed benefits of weight loss for avoiding future need for diabetic medications

## 2017-02-03 NOTE — Patient Instructions (Signed)
It was a pleasure to see you today! Thank you for choosing [facility] for your primary care. Veronica Coleman was seen for heartburn. Come back to the clinic if you start having more frequent heart burn or if you notice the heartburn seems to be timed with strenuous physical activity, and go to the emergency room if you have severe chest pain or trouble breathing.   Today you told us about your heart burn that happened while you were sleeping.  It sounded like this was a simple heartburn but we did and ECG just to double check for changes in your heart since your last ECG in 2015. Your ECG did not have any indication of new damage to your heart.   We will be prescribing a anti-reflux medication to help your symptoms and giving you a flu shot today.  If we did any lab work today, and the results require attention, either me or my nurse will get in touch with you. If everything is normal, you will get a letter in mail and a message via . If you don't hear from [place] in two weeks, please give [place] a call. Otherwise, we look forward to seeing you again at your next visit. If you have any questions or concerns before then, please call the clinic at [phone].  Please bring all your medications to every doctors visit  Sign up for My Chart to have easy access to your labs results, and communication with your Primary care physician.    Please check-out at the front desk before leaving the clinic.    Best,  Dr. Marthenia Rolling FAMILY MEDICINE RESIDENT - PGY1 02/03/2017 3:34 PM

## 2017-02-03 NOTE — Assessment & Plan Note (Addendum)
Patient describes as heart burn that woke her up last night.   Tried tums/water with no real relief.   Patient could not go back to sleep and discomfort slowly resolved.   She claims associated back pain and head ache but not difficulty breathing.  Seems to be likely heartburn given lack of activity and positioning with pain but ECG was performed as a precaution.  ECG showed no ST/twave changes and was consistent with 3 years prior.  Will prescribe prilosec

## 2017-06-06 ENCOUNTER — Other Ambulatory Visit: Payer: Self-pay | Admitting: *Deleted

## 2017-06-08 MED ORDER — HYDROCHLOROTHIAZIDE 25 MG PO TABS: 25.0000 mg | ORAL_TABLET | Freq: Every day | ORAL | 3 refills | Status: DC

## 2017-11-03 ENCOUNTER — Other Ambulatory Visit: Payer: Self-pay | Admitting: Family Medicine

## 2017-11-03 DIAGNOSIS — Z1231 Encounter for screening mammogram for malignant neoplasm of breast: Secondary | ICD-10-CM

## 2017-12-07 ENCOUNTER — Encounter: Payer: BLUE CROSS/BLUE SHIELD | Admitting: Family Medicine

## 2017-12-29 ENCOUNTER — Ambulatory Visit (INDEPENDENT_AMBULATORY_CARE_PROVIDER_SITE_OTHER): Payer: BLUE CROSS/BLUE SHIELD | Admitting: Family Medicine

## 2017-12-29 ENCOUNTER — Other Ambulatory Visit: Payer: Self-pay

## 2017-12-29 ENCOUNTER — Encounter: Payer: Self-pay | Admitting: Family Medicine

## 2017-12-29 VITALS — BP 108/80 | HR 87 | Temp 99.1°F | Ht 65.0 in | Wt 198.0 lb

## 2017-12-29 DIAGNOSIS — I10 Essential (primary) hypertension: Secondary | ICD-10-CM | POA: Diagnosis not present

## 2017-12-29 DIAGNOSIS — Z0000 Encounter for general adult medical examination without abnormal findings: Secondary | ICD-10-CM

## 2017-12-29 DIAGNOSIS — M79672 Pain in left foot: Secondary | ICD-10-CM | POA: Diagnosis not present

## 2017-12-29 DIAGNOSIS — M545 Low back pain, unspecified: Secondary | ICD-10-CM

## 2017-12-29 DIAGNOSIS — R7303 Prediabetes: Secondary | ICD-10-CM

## 2017-12-29 LAB — POCT GLYCOSYLATED HEMOGLOBIN (HGB A1C): Hemoglobin A1C: 5.9 % — AB (ref 4.0–5.6)

## 2017-12-29 MED ORDER — AMLODIPINE BESYLATE 10 MG PO TABS: 10.0000 mg | ORAL_TABLET | Freq: Every day | ORAL | 3 refills | Status: DC

## 2017-12-29 NOTE — Patient Instructions (Addendum)
It was nice seeing you today Veronica Coleman!  We are starting a new blood pressure medication called amlodipine today.  Please take one pill daily and stop your hydrochlorothiazide.  For your back, please try these back exercises and stretches.  With time, they should hopefully help relieve your back pain.  For your foot, the podiatrist's office should call you.  If you have not heard from them in 2 weeks, give [place] a call.  I will get labs today to check your A1c, your kidney function, and your cholesterol.  I will call you if anything is abnormal.  If you have any questions or concerns, please feel free to call the clinic.   Be well,  Dr. Frances Furbish  Back Exercises The following exercises strengthen the muscles that help to support the back. They also help to keep the lower back flexible. Doing these exercises can help to prevent back pain or lessen existing pain. If you have back pain or discomfort, try doing these exercises 2-3 times each day or as told by your health care provider. When the pain goes away, do them once each day, but increase the number of times that you repeat the steps for each exercise (do more repetitions). If you do not have back pain or discomfort, do these exercises once each day or as told by your health care provider. Exercises Single Knee to Chest  Repeat these steps 3-5 times for each leg: 1. Lie on your back on a firm bed or the floor with your legs extended. 2. Bring one knee to your chest. Your other leg should stay extended and in contact with the floor. 3. Hold your knee in place by grabbing your knee or thigh. 4. Pull on your knee until you feel a gentle stretch in your lower back. 5. Hold the stretch for 10-30 seconds. 6. Slowly release and straighten your leg.  Pelvic Tilt  Repeat these steps 5-10 times: 1. Lie on your back on a firm bed or the floor with your legs extended. 2. Bend your knees so they are pointing toward the ceiling and your feet are flat  on the floor. 3. Tighten your lower abdominal muscles to press your lower back against the floor. This motion will tilt your pelvis so your tailbone points up toward the ceiling instead of pointing to your feet or the floor. 4. With gentle tension and even breathing, hold this position for 5-10 seconds.  Cat-Cow  Repeat these steps until your lower back becomes more flexible: 1. Get into a hands-and-knees position on a firm surface. Keep your hands under your shoulders, and keep your knees under your hips. You may place padding under your knees for comfort. 2. Let your head hang down, and point your tailbone toward the floor so your lower back becomes rounded like the back of a cat. 3. Hold this position for 5 seconds. 4. Slowly lift your head and point your tailbone up toward the ceiling so your back forms a sagging arch like the back of a cow. 5. Hold this position for 5 seconds.  Press-Ups  Repeat these steps 5-10 times: 1. Lie on your abdomen (face-down) on the floor. 2. Place your palms near your head, about shoulder-width apart. 3. While you keep your back as relaxed as possible and keep your hips on the floor, slowly straighten your arms to raise the top half of your body and lift your shoulders. Do not use your back muscles to raise your upper torso.  You may adjust the placement of your hands to make yourself more comfortable. 4. Hold this position for 5 seconds while you keep your back relaxed. 5. Slowly return to lying flat on the floor.  Bridges  Repeat these steps 10 times: 1. Lie on your back on a firm surface. 2. Bend your knees so they are pointing toward the ceiling and your feet are flat on the floor. 3. Tighten your buttocks muscles and lift your buttocks off of the floor until your waist is at almost the same height as your knees. You should feel the muscles working in your buttocks and the back of your thighs. If you do not feel these muscles, slide your feet 1-2 inches  farther away from your buttocks. 4. Hold this position for 3-5 seconds. 5. Slowly lower your hips to the starting position, and allow your buttocks muscles to relax completely.  If this exercise is too easy, try doing it with your arms crossed over your chest. Abdominal Crunches  Repeat these steps 5-10 times: 1. Lie on your back on a firm bed or the floor with your legs extended. 2. Bend your knees so they are pointing toward the ceiling and your feet are flat on the floor. 3. Cross your arms over your chest. 4. Tip your chin slightly toward your chest without bending your neck. 5. Tighten your abdominal muscles and slowly raise your trunk (torso) high enough to lift your shoulder blades a tiny bit off of the floor. Avoid raising your torso higher than that, because it can put too much stress on your low back and it does not help to strengthen your abdominal muscles. 6. Slowly return to your starting position.  Back Lifts Repeat these steps 5-10 times: 1. Lie on your abdomen (face-down) with your arms at your sides, and rest your forehead on the floor. 2. Tighten the muscles in your legs and your buttocks. 3. Slowly lift your chest off of the floor while you keep your hips pressed to the floor. Keep the back of your head in line with the curve in your back. Your eyes should be looking at the floor. 4. Hold this position for 3-5 seconds. 5. Slowly return to your starting position.  Contact a health care provider if:  Your back pain or discomfort gets much worse when you do an exercise.  Your back pain or discomfort does not lessen within 2 hours after you exercise. If you have any of these problems, stop doing these exercises right away. Do not do them again unless your health care provider says that you can. Get help right away if:  You develop sudden, severe back pain. If this happens, stop doing the exercises right away. Do not do them again unless your health care provider says  that you can. This information is not intended to replace advice given to you by your health care provider. Make sure you discuss any questions you have with your health care provider. Document Released: 05/26/2004 Document Revised: 08/26/2015 Document Reviewed: 06/12/2014 Elsevier Interactive Patient Education  2017 [employer]

## 2017-12-29 NOTE — Progress Notes (Signed)
   Subjective:    Veronica Coleman - 52 y.o. female MRN [id number]  Date of birth: 10/22/1965  HPI  Veronica Coleman is here for her annual physical.  She would like to discuss her back pain, foot pain, and she would like labs checked.  Back pain -Occurs mostly when she goes from lying to sitting up in the mornings -Is been going on for 2 or 3 years - Is intermittent and changes locations throughout her back -Has tried Mobic Motrin and Tylenol and is not sure if these really help -Nothing seems to really make it worse -No known trauma  Foot pain -Was seen last year for left foot pain -Was sent to sports medicine, and they gave her orthotics after doing an ultrasound -Continues to have swelling in her medial left foot with pain   Health Maintenance:  -Her last A1c showed that she was prediabetic, so patient would like her A1c rechecked today.  She is also amenable to checking a lipid panel and a BMP. Health Maintenance Due  Topic Date Due  . INFLUENZA VACCINE  11/30/2017    -  reports that she has never smoked. She has never used smokeless tobacco. - Review of Systems: Per HPI. - Past Medical History: Patient Active Problem List   Diagnosis Date Noted  . Mechanical foot pain, left 01/02/2018  . Prediabetes 12/07/2016  . Annual physical exam 11/04/2015  . Cystocele 12/24/2012  . Obesity (BMI 30-39.9) 12/20/2011  . Plantar fasciitis 11/01/2011  . Menopause 12/16/2008  . Back pain 05/07/2007  . Chest pain 11/02/2006  . Essential hypertension 09/18/2006  . RHINITIS, ALLERGIC 06/29/2006   - Medications: reviewed and updated   Objective:   Physical Exam BP 108/80   Pulse 87   Temp 99.1 F (37.3 C) (Oral)   Ht 5\' 5"  (1.651 m)   Wt 198 lb (89.8 kg)   SpO2 97%   BMI 32.95 kg/m  Gen: NAD, alert, cooperative with exam, well-appearing HEENT: NCAT, PERRL, clear conjunctiva, oropharynx clear, supple neck CV: RRR, good S1/S2, no murmur, no edema Resp: CTABL, no wheezes,  non-labored Abd: SNTND, BS present, no guarding or organomegaly Skin: no rashes, normal turgor  Neuro: no gross deficits.  Psych: good insight, alert and oriented Musculoskeletal: Lumbar spine: Nontender to palpation of the spine, mildly tender to palpation on right sided lumbar paraspinal muscles, full range of motion of lumbar spine.  Left foot: Swollen, mildly tender area on the medial side of patient's left foot.  Normal gait.       Assessment & Plan:   Back pain Appears to be muscular in nature.  Encouraged patient to use heating pads as needed as well as Tylenol and NSAIDs for symptomatic relief.  Also gave a handout on back exercises and stretches.  Mechanical foot pain, left It is possible that patient has plantar fasciitis causing her chronic pain, but swelling on medial side of the foot does not usually correlate with this.  Will refer to podiatry since patient has failed management with orthotics.  Annual physical exam Will obtain labs as described.  Up to date on mammogram and Pap smear.  Prediabetes Will obtain A1c, lipid panel, and BMP today.  Essential hypertension Will stop HCTZ given its side effect profile and start amlodipine, since this is the recommended medication in African-American patients under 65.    Veronica Coleman, M.D. 01/02/2018, 10:28 AM PGY-2, [facility]

## 2017-12-30 LAB — LIPID PANEL
Chol/HDL Ratio: 2.7 ratio (ref 0.0–4.4)
Cholesterol, Total: 146 mg/dL (ref 100–199)
HDL: 54 mg/dL (ref >39)
LDL CALC: 79 mg/dL (ref 0–99)
Triglycerides: 63 mg/dL (ref 0–149)
VLDL CHOLESTEROL CAL: 13 mg/dL (ref 5–40)

## 2017-12-30 LAB — BASIC METABOLIC PANEL
BUN / CREAT RATIO: 19 (ref 9–23)
BUN: 12 mg/dL (ref 6–24)
CHLORIDE: 102 mmol/L (ref 96–106)
CO2: 26 mmol/L (ref 20–29)
Calcium: 9.6 mg/dL (ref 8.7–10.2)
Creatinine, Ser: 0.63 mg/dL (ref 0.57–1.00)
GFR calc Af Amer: 119 mL/min/{1.73_m2} (ref >59)
GFR calc non Af Amer: 103 mL/min/{1.73_m2} (ref >59)
GLUCOSE: 82 mg/dL (ref 65–99)
POTASSIUM: 4.2 mmol/L (ref 3.5–5.2)
SODIUM: 142 mmol/L (ref 134–144)

## 2018-01-02 ENCOUNTER — Encounter: Payer: Self-pay | Admitting: Family Medicine

## 2018-01-02 DIAGNOSIS — M79672 Pain in left foot: Secondary | ICD-10-CM | POA: Insufficient documentation

## 2018-01-02 NOTE — Assessment & Plan Note (Signed)
Will obtain A1c, lipid panel, and BMP today.

## 2018-01-02 NOTE — Assessment & Plan Note (Signed)
It is possible that patient has plantar fasciitis causing her chronic pain, but swelling on medial side of the foot does not usually correlate with this.  Will refer to podiatry since patient has failed management with orthotics.

## 2018-01-02 NOTE — Assessment & Plan Note (Signed)
Appears to be muscular in nature.  Encouraged patient to use heating pads as needed as well as Tylenol and NSAIDs for symptomatic relief.  Also gave a handout on back exercises and stretches.

## 2018-01-02 NOTE — Assessment & Plan Note (Signed)
Will stop HCTZ given its side effect profile and start amlodipine, since this is the recommended medication in African-American patients under 65.

## 2018-01-02 NOTE — Assessment & Plan Note (Signed)
Will obtain labs as described.  Up to date on mammogram and Pap smear.

## 2018-01-16 ENCOUNTER — Telehealth: Payer: Self-pay | Admitting: *Deleted

## 2018-01-16 NOTE — Telephone Encounter (Signed)
Pt wanted to know the results of her labs.   Advised of message from letter created on 01/02/18. Fleeger, Maryjo RochesterJessica Dawn, CMA

## 2018-01-22 ENCOUNTER — Ambulatory Visit: Payer: BLUE CROSS/BLUE SHIELD

## 2018-01-22 ENCOUNTER — Ambulatory Visit
Admission: RE | Admit: 2018-01-22 | Discharge: 2018-01-22 | Disposition: A | Discharge status: Home / Self Care | Payer: BLUE CROSS/BLUE SHIELD | Source: Ambulatory Visit | Attending: Family Medicine | Admitting: Family Medicine

## 2018-01-22 ENCOUNTER — Ambulatory Visit: Payer: Self-pay | Admitting: Podiatry

## 2018-01-22 DIAGNOSIS — Z1231 Encounter for screening mammogram for malignant neoplasm of breast: Secondary | ICD-10-CM | POA: Diagnosis not present

## 2018-02-05 ENCOUNTER — Ambulatory Visit (INDEPENDENT_AMBULATORY_CARE_PROVIDER_SITE_OTHER): Payer: BLUE CROSS/BLUE SHIELD | Admitting: Podiatry

## 2018-02-05 ENCOUNTER — Encounter: Payer: Self-pay | Admitting: Podiatry

## 2018-02-05 VITALS — BP 125/82 | HR 84 | Ht 66.0 in | Wt 195.0 lb

## 2018-02-05 DIAGNOSIS — M216X1 Other acquired deformities of right foot: Secondary | ICD-10-CM | POA: Diagnosis not present

## 2018-02-05 DIAGNOSIS — M216X2 Other acquired deformities of left foot: Secondary | ICD-10-CM | POA: Diagnosis not present

## 2018-02-05 DIAGNOSIS — M722 Plantar fascial fibromatosis: Secondary | ICD-10-CM | POA: Diagnosis not present

## 2018-02-05 NOTE — Progress Notes (Signed)
SUBJECTIVE: 52 y.o. year old female presents complaining of pain at instep of both feet x 2 years. Also having problem with toe nail problem.  Been treated for HTN.  Review of Systems  Constitutional: Negative.   HENT: Negative.   Eyes: Negative.   Respiratory: Negative.   Cardiovascular: Negative.   Gastrointestinal: Negative.   Genitourinary: Negative.   Musculoskeletal: Negative.   Skin: Negative.      OBJECTIVE: DERMATOLOGIC EXAMINATION: Atrophic and dystrophic nails x 10.  VASCULAR EXAMINATION OF LOWER LIMBS: All pedal pulses are palpable with normal pulsation.  Capillary Filling times within 3 seconds in all digits.  Temperature gradient from tibial crest to dorsum of foot is within normal bilateral.  NEUROLOGIC EXAMINATION OF THE LOWER LIMBS: All epicritic and tactile sensations grossly intact. Sharp and Dull discriminatory sensations at the plantar ball of hallux is intact bilateral.   MUSCULOSKELETAL EXAMINATION: Positive for  Flattening instep both feet. Plantar and medial advancement of TNJ complex both feet.  General overview: AP View:  Increased lateral deviation of CCJ bilateral.  Lateral view:  Pronated STJ bilateral L>R. Anterior breach in CYMA line bilateral. Hypertrophic arthritic change in TNJ left.   ASSESSMENT: STJ hyperpronation bilateral. Flat foot. Tenosynovitis bilateral. Plantar fasciitis bilateral.  PLAN: Reviewed findings and available treatment options. Reviewed x-ray findings. Both feet casted for Orthotics.

## 2018-02-05 NOTE — Patient Instructions (Addendum)
Seen for bilateral foot pain. Noted of collapsing instep arch both feet. May benefit from Custom orthotics. Both feet casted for Orthotics.

## 2018-05-28 ENCOUNTER — Ambulatory Visit (INDEPENDENT_AMBULATORY_CARE_PROVIDER_SITE_OTHER): Payer: BLUE CROSS/BLUE SHIELD | Admitting: *Deleted

## 2018-05-28 DIAGNOSIS — Z23 Encounter for immunization: Secondary | ICD-10-CM | POA: Diagnosis not present

## 2018-12-17 ENCOUNTER — Other Ambulatory Visit: Payer: Self-pay | Admitting: Family Medicine

## 2018-12-17 DIAGNOSIS — Z1231 Encounter for screening mammogram for malignant neoplasm of breast: Secondary | ICD-10-CM

## 2018-12-24 ENCOUNTER — Encounter (HOSPITAL_COMMUNITY): Payer: Self-pay | Admitting: Emergency Medicine

## 2018-12-24 ENCOUNTER — Emergency Department (HOSPITAL_COMMUNITY)
Admission: EM | Admit: 2018-12-24 | Discharge: 2018-12-24 | Disposition: A | Discharge status: Home / Self Care | Payer: BC Managed Care – PPO | Attending: Emergency Medicine | Admitting: Emergency Medicine

## 2018-12-24 ENCOUNTER — Other Ambulatory Visit: Payer: Self-pay

## 2018-12-24 DIAGNOSIS — Z79899 Other long term (current) drug therapy: Secondary | ICD-10-CM | POA: Insufficient documentation

## 2018-12-24 DIAGNOSIS — Z20828 Contact with and (suspected) exposure to other viral communicable diseases: Secondary | ICD-10-CM | POA: Insufficient documentation

## 2018-12-24 DIAGNOSIS — Z20822 Contact with and (suspected) exposure to covid-19: Secondary | ICD-10-CM

## 2018-12-24 DIAGNOSIS — Z03818 Encounter for observation for suspected exposure to other biological agents ruled out: Secondary | ICD-10-CM | POA: Diagnosis not present

## 2018-12-24 DIAGNOSIS — I10 Essential (primary) hypertension: Secondary | ICD-10-CM | POA: Diagnosis not present

## 2018-12-24 NOTE — ED Triage Notes (Signed)
Pt states that a coworker had a positive covid test on Saturday. Pt states she has been wearing a mask and gloves at work and denies any symptoms. Pt states that her employer sent her here to be tested.

## 2018-12-24 NOTE — Discharge Instructions (Addendum)
Quarantine at home until you get your results which should take 2-3 days

## 2018-12-24 NOTE — ED Provider Notes (Signed)
[facility] EMERGENCY DEPARTMENT Provider Note   CSN: [id number] Arrival date & time: 12/24/18  1933     History   Chief Complaint Chief Complaint  Patient presents with  . covid exposure    HPI Veronica Coleman is a 53 y.o. female who presents for COVID testing.  Past medical history significant for hypertension.  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  She worked with this individual 2 days ago.  She was told by [employer] that she needed to get tested.  She decided to come to the emergency department because she wanted to get a quicker test.  She denies any symptoms.  She does feel very anxious about being here and states that she did take her blood pressure medicine today.     HPI  Past Medical History:  Diagnosis Date  . Environmental allergies   . Hypertension     Patient Active Problem List   Diagnosis Date Noted  . Mechanical foot pain, left 01/02/2018  . Prediabetes 12/07/2016  . Annual physical exam 11/04/2015  . Cystocele 12/24/2012  . Obesity (BMI 30-39.9) 12/20/2011  . Plantar fasciitis 11/01/2011  . Menopause 12/16/2008  . Back pain 05/07/2007  . Chest pain 11/02/2006  . Essential hypertension 09/18/2006  . RHINITIS, ALLERGIC 06/29/2006    History reviewed. No pertinent surgical history.   OB History   No obstetric history on file.      Home Medications    Prior to Admission medications   Medication Sig Start Date End Date Taking? Authorizing Provider  amLODipine (NORVASC) 10 MG tablet Take 1 tablet (10 mg total) by mouth daily. 12/29/17   Kathrene Alu, MD  omeprazole (PRILOSEC) 20 MG capsule Take 1 capsule (20 mg total) by mouth daily. 02/03/17   Sherene Sires, DO    Family History Family History  Problem Relation Age of Onset  . Hypertension Mother   . Asthma Son   . Colon cancer Neg Hx   . Breast cancer Neg Hx     Social History  Social History   Tobacco Use  . Smoking status: Never Smoker  . Smokeless tobacco: Never Used  Substance Use Topics  . Alcohol use: No  . Drug use: No     Allergies   Patient has no known allergies.   Review of Systems Review of Systems  Constitutional: Negative for fever.  Respiratory: Negative for cough and shortness of breath.   Cardiovascular: Negative for chest pain.     Physical Exam Updated Vital Signs BP (!) 151/83 (BP Location: Right Arm)   Pulse 96   Temp 98.9 F (37.2 C) (Oral)   Resp 17   SpO2 100%   Physical Exam Vitals signs and nursing note reviewed.  Constitutional:      General: She is not in acute distress.    Appearance: Normal appearance. She is well-developed. She is not ill-appearing.  HENT:     Head: Normocephalic and atraumatic.  Eyes:     General: No scleral icterus.       Right eye: No discharge.        Left eye: No discharge.     Conjunctiva/sclera: Conjunctivae normal.     Pupils: Pupils are equal, round, and reactive to light.  Neck:     Musculoskeletal: Normal range of motion.  Cardiovascular:     Rate and Rhythm: Normal rate and regular rhythm.  Pulmonary:     Effort: Pulmonary effort is normal. No respiratory distress.     Breath sounds: Normal breath sounds.  Abdominal:     General: There is no distension.  Skin:    General: Skin is warm and dry.  Neurological:     Mental Status: She is alert and oriented to person, place, and time.  Psychiatric:        Behavior: Behavior normal.      ED Treatments / Results  Labs (all labs ordered are listed, but only abnormal results are displayed) Labs Reviewed  NOVEL CORONAVIRUS, NAA (HOSP ORDER, SEND-OUT TO REF LAB; TAT 18-24 HRS)    EKG None  Radiology No results found.  Procedures Procedures (including critical care time)  Medications Ordered in ED Medications - No data to display   Initial Impression / Assessment and Plan / ED Course  I have reviewed the  triage vital signs and the nursing notes.  Pertinent labs & imaging results that were available during my care of the patient were reviewed by me and considered in my medical decision making (see chart for details).  53 year old female presents for COVID testing after close contact with a code positive individual.  She is tachycardic and hypertensive here.  She states that she is very anxious.  On recheck vitals have improved after she is calm down some.  Her exam is unremarkable.  She is asymptomatic.  Outpatient COVID testing was done she was advised to self quarantine.  Final Clinical Impressions(s) / ED Diagnoses   Final diagnoses:  Close Exposure to Covid-19 Virus    ED Discharge Orders    None       Bethel BornGekas, Farrell Pantaleo Marie, PA-C 12/24/18 2231    Derwood KaplanNanavati, Ankit, MD 12/25/18 [phone]

## 2018-12-26 ENCOUNTER — Telehealth: Payer: Self-pay | Admitting: Family Medicine

## 2018-12-26 ENCOUNTER — Encounter: Payer: Self-pay | Admitting: Family Medicine

## 2018-12-26 LAB — NOVEL CORONAVIRUS, NAA (HOSP ORDER, SEND-OUT TO REF LAB; TAT 18-24 HRS): SARS-CoV-2, NAA: NOT DETECTED

## 2018-12-26 NOTE — Telephone Encounter (Signed)
Created a letter showing a negative result for COVID test and when I called pt she wanted to know if she could go back to work and I told her that I would send a message to the doctor and see and if she is able to return will redo her letter stating she could go back. Veronica Coleman, CMA

## 2018-12-26 NOTE — Telephone Encounter (Signed)
Pt is calling and would like Dr. Shan Levans to write a letter for her employer stating her covid result was negative.   Please call pt when letter is ready to be picked up at the front desk.

## 2018-12-27 NOTE — Telephone Encounter (Signed)
Ms. Druckenmiller can return to work with her negative COVID test as long as she has had no fever (temperature 100.4 or greater) for 24 hours.  Thank you.

## 2018-12-28 NOTE — Telephone Encounter (Signed)
LVM to call office back to inform her that we have created a new letter for her job.  It has been sent in  [place] and a copy will be upfront for pt to pick up. [name], CMA

## 2018-12-31 ENCOUNTER — Other Ambulatory Visit: Payer: Self-pay

## 2018-12-31 ENCOUNTER — Ambulatory Visit (INDEPENDENT_AMBULATORY_CARE_PROVIDER_SITE_OTHER): Payer: BC Managed Care – PPO | Admitting: Family Medicine

## 2018-12-31 DIAGNOSIS — B372 Candidiasis of skin and nail: Secondary | ICD-10-CM | POA: Diagnosis not present

## 2018-12-31 MED ORDER — NYSTATIN 100000 UNIT/GM EX CREA: 1.0000 "application " | TOPICAL_CREAM | Freq: Four times a day (QID) | CUTANEOUS | 1 refills | Status: AC

## 2018-12-31 MED ORDER — NYSTATIN 100000 UNIT/GM EX POWD: Freq: Four times a day (QID) | CUTANEOUS | 0 refills | Status: DC

## 2018-12-31 NOTE — Progress Notes (Signed)
     Subjective: Chief Complaint  Patient presents with  . rash under breast     HPI: Veronica Coleman is a 53 y.o. presenting to clinic today to discuss the following:  Rash Rash under breasts and now migrating to the midline. Areas of hyperpigmented erythematous satellite lesions. Itching but not painful and have persisted for several weeks. She has not had this in the past. No recent change in soaps, shampoos, or other products. She has tried OTC hydrocortisone cream with no improvement.  ROS noted in HPI.   Past Medical, Surgical, Social, and Family History Reviewed & Updated per EMR.   Pertinent Historical Findings include:   Social History   Tobacco Use  Smoking Status Never Smoker  Smokeless Tobacco Never Used   Objective: BP (!) 156/72   Pulse (!) 110   Wt 194 lb 3.2 oz (88.1 kg)   SpO2 98%   BMI 31.34 kg/m  Vitals and nursing notes reviewed  Physical Exam Gen: Alert and Oriented x 3, NAD CV: RRR, no murmurs, normal S1, S2 split Resp: CTAB, no wheezing, rales, or rhonchi, comfortable work of breathing Ext: no clubbing, cyanosis, or edema Skin: warm, dry, intact, erythematous satellite lesions under both breasts and at the midline epigastric area  Assessment/Plan:  Skin candidiasis Most likely yeast infection of the skin given symptoms and location - Nystatin cream during the night while dry - Nystatin powder during the day and if she knows she will be sweating   PATIENT EDUCATION PROVIDED: See AVS    Diagnosis and plan along with any newly prescribed medication(s) were discussed in detail with this patient today. The patient verbalized understanding and agreed with the plan. Patient advised if symptoms worsen return to clinic or ER.   No orders of the defined types were placed in this encounter.   Meds ordered this encounter  Medications  . nystatin cream (MYCOSTATIN)    Sig: Apply 1 application topically 4 (four) times daily for 14 days. Apply to  rash 4 times daily for 2 weeks.    Dispense:  30 g    Refill:  1  . nystatin (MYCOSTATIN/NYSTOP) powder    Sig: Apply topically 4 (four) times daily.    Dispense:  15 g    Refill:  0     Harolyn Rutherford, DO 12/31/2018, 3:25 PM PGY-3 [place]

## 2018-12-31 NOTE — Patient Instructions (Signed)

## 2019-01-01 DIAGNOSIS — B372 Candidiasis of skin and nail: Secondary | ICD-10-CM | POA: Insufficient documentation

## 2019-01-01 NOTE — Assessment & Plan Note (Signed)
Most likely yeast infection of the skin given symptoms and location - Nystatin cream during the night while dry - Nystatin powder during the day and if she knows she will be sweating

## 2019-01-21 ENCOUNTER — Other Ambulatory Visit (HOSPITAL_COMMUNITY)
Admission: RE | Admit: 2019-01-21 | Discharge: 2019-01-21 | Disposition: A | Discharge status: Home / Self Care | Payer: BC Managed Care – PPO | Source: Ambulatory Visit | Attending: Family Medicine | Admitting: Family Medicine

## 2019-01-21 ENCOUNTER — Other Ambulatory Visit: Payer: Self-pay

## 2019-01-21 ENCOUNTER — Ambulatory Visit (INDEPENDENT_AMBULATORY_CARE_PROVIDER_SITE_OTHER): Payer: BC Managed Care – PPO | Admitting: Family Medicine

## 2019-01-21 ENCOUNTER — Encounter: Payer: Self-pay | Admitting: Family Medicine

## 2019-01-21 VITALS — BP 124/78 | HR 87 | Wt 193.2 lb

## 2019-01-21 DIAGNOSIS — Z124 Encounter for screening for malignant neoplasm of cervix: Secondary | ICD-10-CM

## 2019-01-21 DIAGNOSIS — Z0000 Encounter for general adult medical examination without abnormal findings: Secondary | ICD-10-CM

## 2019-01-21 DIAGNOSIS — Z23 Encounter for immunization: Secondary | ICD-10-CM

## 2019-01-21 DIAGNOSIS — R7303 Prediabetes: Secondary | ICD-10-CM

## 2019-01-21 DIAGNOSIS — B372 Candidiasis of skin and nail: Secondary | ICD-10-CM

## 2019-01-21 LAB — POCT GLYCOSYLATED HEMOGLOBIN (HGB A1C): HbA1c, POC (prediabetic range): 5.8 % (ref 5.7–6.4)

## 2019-01-21 MED ORDER — CLOTRIMAZOLE 1 % EX CREA: 1.0000 "application " | TOPICAL_CREAM | Freq: Two times a day (BID) | CUTANEOUS | 2 refills | Status: DC

## 2019-01-21 NOTE — Patient Instructions (Addendum)
It was nice seeinig you today Ms. Mcmahan!  Today, we performed a Pap smear.  We will let you know what these results are when they return.  We also are getting labs to check your electrolytes and kidney function as well as your cholesterol.  We will discuss these labs with you as well.  I have sent clotrimazole cream for your rash.  Please apply this twice daily and let me know if you do not see improvement in the next month or two.  We will plan to see you again next year unless you have any other concerns.  If you have any questions or concerns, please feel free to call the clinic.   Be well,  Dr. Shan Levans

## 2019-01-21 NOTE — Progress Notes (Signed)
   Subjective:    Veronica Coleman - 53 y.o. female MRN [id number]  Date of birth: 11-11-1965  CC:  Veronica Coleman is here for her annual physical.  She is due for a pap smear today.  She would also like to discuss her prediabetes and rash.  HPI: Rash under breasts - hyperpigmentation - started in July of this year - was prescribed nystatin powder and has noted no improvement - will sometimes itch but never hurts - more sensitive when washing with hot water  Prediabetes - continues to be managed with diet - no hyperglycemic symptoms  Health Maintenance:  - patient requests flu shot today Health Maintenance Due  Topic Date Due  . PAP SMEAR-Modifier  12/23/2016    -  reports that she has never smoked. She has never used smokeless tobacco. - Review of Systems: Per HPI. - Past Medical History: Patient Active Problem List   Diagnosis Date Noted  . Skin candidiasis 01/01/2019  . Mechanical foot pain, left 01/02/2018  . Prediabetes 12/07/2016  . Annual physical exam 11/04/2015  . Cystocele 12/24/2012  . Obesity (BMI 30-39.9) 12/20/2011  . Plantar fasciitis 11/01/2011  . Menopause 12/16/2008  . Back pain 05/07/2007  . Chest pain 11/02/2006  . Essential hypertension 09/18/2006  . RHINITIS, ALLERGIC 06/29/2006   - Medications: reviewed and updated   Objective:   Physical Exam BP 124/78   Pulse 87   Wt 193 lb 3.2 oz (87.6 kg)   SpO2 100%   BMI 31.18 kg/m  Gen: NAD, alert, cooperative with exam, well-appearing CV: RRR, good S1/S2, no murmur, no edema Resp: CTABL, no wheezes, non-labored Abd: SNTND, BS present, no guarding or organomegaly GU: Normal vulva, vagina, and cervix without lesions. Skin: macular, hyperpigmented rash on underside of breasts.  No signs of bacterial infection.  Psych: good insight, alert and oriented    Assessment & Plan:   Prediabetes Continues to be well controlled at 5.8 today.  We will also obtain lipid panel and BMP.  No new changes.   Skin candidiasis Appears to be a yeast infection given intertriginous location and worsened symptoms during the summer.  Counseled patient on keeping this area as dry as possible.  Since nystatin powder was ineffective, we will try clotrimazole cream.  Annual physical exam Pap smear performed today.  Up-to-date on mammogram and colonoscopy.    Maia Breslow, M.D. 01/23/2019, 1:05 PM PGY-3, [place]

## 2019-01-22 LAB — LIPID PANEL
Chol/HDL Ratio: 2.7 ratio (ref 0.0–4.4)
Cholesterol, Total: 175 mg/dL (ref 100–199)
HDL: 65 mg/dL (ref >39)
LDL Chol Calc (NIH): 96 mg/dL (ref 0–99)
Triglycerides: 74 mg/dL (ref 0–149)
VLDL Cholesterol Cal: 14 mg/dL (ref 5–40)

## 2019-01-22 LAB — BASIC METABOLIC PANEL
BUN/Creatinine Ratio: 21 (ref 9–23)
BUN: 14 mg/dL (ref 6–24)
CO2: 24 mmol/L (ref 20–29)
Calcium: 9.7 mg/dL (ref 8.7–10.2)
Chloride: 102 mmol/L (ref 96–106)
Creatinine, Ser: 0.67 mg/dL (ref 0.57–1.00)
GFR calc Af Amer: 116 mL/min/{1.73_m2} (ref >59)
GFR calc non Af Amer: 101 mL/min/{1.73_m2} (ref >59)
Glucose: 90 mg/dL (ref 65–99)
Potassium: 4.4 mmol/L (ref 3.5–5.2)
Sodium: 140 mmol/L (ref 134–144)

## 2019-01-23 ENCOUNTER — Encounter: Payer: Self-pay | Admitting: Family Medicine

## 2019-01-23 LAB — CYTOLOGY - PAP
Diagnosis: NEGATIVE
High risk HPV: NEGATIVE
Molecular Disclaimer: 56
Molecular Disclaimer: DETECTED
Molecular Disclaimer: NORMAL

## 2019-01-23 NOTE — Assessment & Plan Note (Signed)
Pap smear performed today.  Up-to-date on mammogram and colonoscopy.

## 2019-01-23 NOTE — Assessment & Plan Note (Signed)
Appears to be a yeast infection given intertriginous location and worsened symptoms during the summer.  Counseled patient on keeping this area as dry as possible.  Since nystatin powder was ineffective, we will try clotrimazole cream.

## 2019-01-23 NOTE — Assessment & Plan Note (Signed)
Continues to be well controlled at 5.8 today.  We will also obtain lipid panel and BMP.  No new changes.

## 2019-01-24 ENCOUNTER — Encounter: Payer: Self-pay | Admitting: Family Medicine

## 2019-01-28 ENCOUNTER — Other Ambulatory Visit: Payer: Self-pay

## 2019-01-28 ENCOUNTER — Ambulatory Visit
Admission: RE | Admit: 2019-01-28 | Discharge: 2019-01-28 | Disposition: A | Discharge status: Home / Self Care | Payer: BLUE CROSS/BLUE SHIELD | Source: Ambulatory Visit | Attending: Hematology and Oncology | Admitting: Hematology and Oncology

## 2019-01-28 DIAGNOSIS — Z1231 Encounter for screening mammogram for malignant neoplasm of breast: Secondary | ICD-10-CM

## 2019-04-23 ENCOUNTER — Other Ambulatory Visit: Payer: Self-pay | Admitting: Family Medicine

## 2019-08-02 ENCOUNTER — Other Ambulatory Visit: Payer: Self-pay | Admitting: Family Medicine

## 2020-01-07 ENCOUNTER — Other Ambulatory Visit: Payer: Self-pay | Admitting: Family Medicine

## 2020-01-07 DIAGNOSIS — Z1231 Encounter for screening mammogram for malignant neoplasm of breast: Secondary | ICD-10-CM

## 2020-02-02 NOTE — Progress Notes (Signed)
    SUBJECTIVE:   CHIEF COMPLAINT / HPI:   Patient presents for her annual physical. She has no complaints or concerns today.  Health maintenance: due for flu shot and Hep C screening.  Mammogram scheduled for this morning but patient was unable to attend (overlapped with this appointment). She will call to reschedule.  Has already received COVID vaccine and is up to date on PAP and colonoscopy.  HTN Takes amlodipine 10mg  daily. Excellent compliance. No leg swelling or other adverse effects.  PERTINENT  PMH / PSH: HTN, prediabetes, GERD  OBJECTIVE:   BP 130/78   Pulse 71   Ht 5\' 6"  (1.676 m)   Wt 200 lb (90.7 kg)   SpO2 94%   BMI 32.28 kg/m   Gen: alert, well-appearing, pleasant middle-aged female Neck: no cervical lymphadenopathy, normal thyroid Cardiac: RRR, normal S1/S2, no murmurs, rubs or gallops Lungs: Normal work of breathing, lungs CTA without wheezes or rales Abd: +BS, soft, nontender, nondistended Ext: no significant peripheral edema. Mild chronic bony abnormalities of L foot.  ASSESSMENT/PLAN:   Annual physical exam No abnormalities noted on physical exam. -Given flu shot today -Patient will call to reschedule mammogram -Will obtain Hep C -Counseled on healthy lifestyle behaviors (diet and exercise) -Up to date on PAP and colonoscopy  Prediabetes Well-controlled, A1c 5.9% today (has been stable between 5.8-6.0 over last several years). -Continue diet-control  Essential hypertension Well-controlled. BP 130/78 today  -Continue Amlodipine 10mg  daily -Encouraged patient to take Amlodipine at bedtime -Will obtain BMP today  GERD (gastroesophageal reflux disease) Stable, well-controlled. -Refills provided for Omeprazole 20mg     Discussed case with Dr. .  , MD [facility]

## 2020-02-03 ENCOUNTER — Encounter: Payer: BC Managed Care – PPO | Admitting: Family Medicine

## 2020-02-03 ENCOUNTER — Ambulatory Visit (INDEPENDENT_AMBULATORY_CARE_PROVIDER_SITE_OTHER): Payer: BC Managed Care – PPO | Admitting: Family Medicine

## 2020-02-03 ENCOUNTER — Encounter: Payer: Self-pay | Admitting: Family Medicine

## 2020-02-03 ENCOUNTER — Ambulatory Visit: Payer: BC Managed Care – PPO

## 2020-02-03 ENCOUNTER — Other Ambulatory Visit: Payer: Self-pay

## 2020-02-03 VITALS — BP 130/78 | HR 71 | Ht 66.0 in | Wt 200.0 lb

## 2020-02-03 DIAGNOSIS — K219 Gastro-esophageal reflux disease without esophagitis: Secondary | ICD-10-CM | POA: Diagnosis not present

## 2020-02-03 DIAGNOSIS — Z23 Encounter for immunization: Secondary | ICD-10-CM | POA: Diagnosis not present

## 2020-02-03 DIAGNOSIS — R7303 Prediabetes: Secondary | ICD-10-CM | POA: Diagnosis not present

## 2020-02-03 DIAGNOSIS — I10 Essential (primary) hypertension: Secondary | ICD-10-CM

## 2020-02-03 DIAGNOSIS — Z0000 Encounter for general adult medical examination without abnormal findings: Secondary | ICD-10-CM | POA: Diagnosis not present

## 2020-02-03 LAB — POCT GLYCOSYLATED HEMOGLOBIN (HGB A1C): HbA1c, POC (prediabetic range): 5.9 % (ref 5.7–6.4)

## 2020-02-03 MED ORDER — OMEPRAZOLE 20 MG PO CPDR: 20.0000 mg | DELAYED_RELEASE_CAPSULE | Freq: Every day | ORAL | 3 refills | Status: DC

## 2020-02-03 NOTE — Assessment & Plan Note (Signed)
Well-controlled, A1c 5.9% today (has been stable between 5.8-6.0 over last several years). -Continue diet-control

## 2020-02-03 NOTE — Assessment & Plan Note (Signed)
Stable, well-controlled. -Refills provided for Omeprazole 20mg 

## 2020-02-03 NOTE — Patient Instructions (Addendum)
It was great to see you!  Things we discussed at today's visit: -One of the best things you can do for your overall health is to exercise. You can start small (just 10 minutes of walking 3x/week) and ideally work your way up to 5x/week.  -Try taking your Amlodipine (blood pressure medicine) at night. If you find you're forgetting to take it, you can switch back to mornings.   -Voltaren gel is the name of the cream you can use for your knee.  -Please call to reschedule your mammogram.  We are checking some basic labs today. I will inform you of the results via MyChart.  Take care and seek immediate care sooner if you develop any concerns.  Dr. Est[facility]

## 2020-02-03 NOTE — Assessment & Plan Note (Signed)
-  Flu shot today -Patient will call to reschedule mammogram -Will obtain Hep C -Counseled on healthy lifestyle behaviors (diet and exercise) -Up to date on PAP and colonoscopy

## 2020-02-03 NOTE — Assessment & Plan Note (Signed)
Well-controlled. BP 130/78 today.  -Continue Amlodipine 10mg  daily -Encouraged patient to take Amlodipine at bedtime -Will obtain BMP today

## 2020-02-04 LAB — BASIC METABOLIC PANEL
BUN/Creatinine Ratio: 23 (ref 9–23)
BUN: 13 mg/dL (ref 6–24)
CO2: 25 mmol/L (ref 20–29)
Calcium: 9.4 mg/dL (ref 8.7–10.2)
Chloride: 104 mmol/L (ref 96–106)
Creatinine, Ser: 0.57 mg/dL (ref 0.57–1.00)
GFR calc Af Amer: 122 mL/min/{1.73_m2} (ref >59)
GFR calc non Af Amer: 105 mL/min/{1.73_m2} (ref >59)
Glucose: 98 mg/dL (ref 65–99)
Potassium: 4.2 mmol/L (ref 3.5–5.2)
Sodium: 141 mmol/L (ref 134–144)

## 2020-02-04 LAB — HEPATITIS C ANTIBODY: Hep C Virus Ab: 0.1 s/co ratio (ref 0.0–0.9)

## 2020-08-27 ENCOUNTER — Encounter (HOSPITAL_COMMUNITY): Payer: Self-pay | Admitting: Emergency Medicine

## 2020-08-27 ENCOUNTER — Other Ambulatory Visit: Payer: Self-pay

## 2020-08-27 ENCOUNTER — Ambulatory Visit (HOSPITAL_COMMUNITY)
Admission: EM | Admit: 2020-08-27 | Discharge: 2020-08-27 | Disposition: A | Discharge status: Home / Self Care | Payer: Managed Care, Other (non HMO) | Attending: Family Medicine | Admitting: Family Medicine

## 2020-08-27 DIAGNOSIS — N12 Tubulo-interstitial nephritis, not specified as acute or chronic: Secondary | ICD-10-CM | POA: Diagnosis not present

## 2020-08-27 DIAGNOSIS — R509 Fever, unspecified: Secondary | ICD-10-CM

## 2020-08-27 LAB — POCT URINALYSIS DIPSTICK, ED / UC
Bilirubin Urine: NEGATIVE
Glucose, UA: NEGATIVE mg/dL
Ketones, ur: NEGATIVE mg/dL
Nitrite: NEGATIVE
Protein, ur: NEGATIVE mg/dL
Specific Gravity, Urine: 1.02 (ref 1.005–1.030)
Urobilinogen, UA: 0.2 mg/dL (ref 0.0–1.0)
pH: 8.5 — ABNORMAL HIGH (ref 5.0–8.0)

## 2020-08-27 MED ORDER — IBUPROFEN 800 MG PO TABS
800.0000 mg | ORAL_TABLET | Freq: Once | ORAL | Status: AC
  Administered 2020-08-27: 800 mg via ORAL

## 2020-08-27 MED ORDER — CEPHALEXIN 500 MG PO CAPS: 500.0000 mg | ORAL_CAPSULE | Freq: Two times a day (BID) | ORAL | 0 refills | Status: DC

## 2020-08-27 MED ORDER — IBUPROFEN 800 MG PO TABS
ORAL_TABLET | ORAL | Status: AC
  Filled 2020-08-27: qty 1

## 2020-08-27 MED ORDER — LIDOCAINE HCL (PF) 1 % IJ SOLN
INTRAMUSCULAR | Status: AC
  Filled 2020-08-27: qty 2

## 2020-08-27 MED ORDER — CEFTRIAXONE SODIUM 1 G IJ SOLR
INTRAMUSCULAR | Status: AC
  Filled 2020-08-27: qty 10

## 2020-08-27 MED ORDER — CEFTRIAXONE SODIUM 1 G IJ SOLR
1.0000 g | Freq: Once | INTRAMUSCULAR | Status: AC
  Administered 2020-08-27: 1 g via INTRAMUSCULAR

## 2020-08-27 NOTE — ED Provider Notes (Signed)
[facility]    ASSESSMENT & PLAN:  1. Pyelonephritis   2. Fever, unspecified fever cause    She is comfortable with home observation. Ensure adequate fluid intake. No indications at this time for hospital evaluation/admission. Discussed.  Meds ordered this encounter  Medications  . ibuprofen (ADVIL) tablet 800 mg  . cefTRIAXone (ROCEPHIN) injection 1 g  . cephALEXin (KEFLEX) 500 MG capsule    Sig: Take 1 capsule (500 mg total) by mouth 2 (two) times daily.    Dispense:  14 capsule    Refill:  0   Urine culture sent.   Follow-up Information    Maury Dus, MD.   Specialty: Family Medicine Why: As needed. Contact information: [address] [place] [address] [phone]        [facility] EMERGENCY DEPARTMENT.   Specialty: Emergency Medicine Why: If worsening or failing to improve as anticipated. Contact information: [address] [id number] mc [place] [address] [phone]               Outlined signs and symptoms indicating need for more acute intervention. Patient verbalized understanding. After Visit Summary given.  SUBJECTIVE:  Veronica Coleman is a 55 y.o. female who complains of urinary frequency, urgency and mild  dysuria for the past 2 days. Without associated vaginal discharge or bleeding. Subj fever/chills. Fatigued. Gross hematuria: not present. No specific aggravating or alleviating factors reported. No LE edema. Normal PO intake without n/v/d. Without specific abdominal pain. Ambulatory without difficulty. No treatment PTA.  LMP: No LMP recorded. Patient is postmenopausal.   OBJECTIVE:  Vitals:   08/27/20 1738  BP: (!) 159/89  Pulse: 99  Resp: 18  Temp: (!) 101.9 F (38.8 C)  TempSrc: Oral  SpO2: 98%   General appearance: alert; no distress HENT: oropharynx: moist Lungs: unlabored respirations Abdomen: soft, non-tender; bowel sounds normal; no masses or organomegaly;  no guarding or rebound tenderness Back: mild bilateral CVA tenderness Extremities: no edema; symmetrical with no gross deformities Skin: warm and dry Neurologic: normal gait Psychological: alert and cooperative; normal mood and affect  Labs Reviewed  POCT URINALYSIS DIPSTICK, ED / UC - Abnormal; Notable for the following components:      Result Value   Hgb urine dipstick TRACE (*)    pH 8.5 (*)    Leukocytes,Ua MODERATE (*)    All other components within normal limits    No Known Allergies  Past Medical History:  Diagnosis Date  . Environmental allergies   . Hypertension    Social History   Socioeconomic History  . Marital status: Married    Spouse name: Not on file  . Number of children: Not on file  . Years of education: Not on file  . Highest education level: Not on file  Occupational History  . Not on file  Tobacco Use  . Smoking status: Never Smoker  . Smokeless tobacco: Never Used  Substance and Sexual Activity  . Alcohol use: No  . Drug use: No  . Sexual activity: Not on file  Other Topics Concern  . Not on file  Social History Narrative  . Not on file   Social Determinants of Health   Financial Resource Strain: Not on file  Food Insecurity: Not on file  Transportation Needs: Not on file  Physical Activity: Not on file  Stress: Not on file  Social Connections: Not on file  Intimate Partner Violence: Not on file   Family History  Problem  Relation Age of Onset  . Hypertension Mother   . Asthma Son   . Colon cancer Neg Hx   . Breast cancer Neg Hx        Mardella Layman, MD 08/27/20 [phone]

## 2020-08-27 NOTE — ED Triage Notes (Signed)
Pt c/o UTI sx onset 3 days associated w/dysuria, fevers  Denies n/v/d  She is drinking more water to help the pain and taking OTC ibuprofen w/no relief.   A&O x4... NAD.Marland Kitchen. ambulatory

## 2020-08-27 NOTE — Discharge Instructions (Addendum)
Meds ordered this encounter  Medications   ibuprofen (ADVIL) tablet 800 mg   cefTRIAXone (ROCEPHIN) injection 1 g   cephALEXin (KEFLEX) 500 MG capsule    Sig: Take 1 capsule (500 mg total) by mouth 2 (two) times daily.    Dispense:  14 capsule    Refill:  0

## 2020-10-09 ENCOUNTER — Other Ambulatory Visit: Payer: Self-pay

## 2020-10-09 MED ORDER — AMLODIPINE BESYLATE 10 MG PO TABS: 1.0000 | ORAL_TABLET | Freq: Every day | ORAL | 3 refills | Status: DC

## 2021-01-06 ENCOUNTER — Other Ambulatory Visit: Payer: Self-pay | Admitting: Hematology and Oncology

## 2021-01-06 DIAGNOSIS — Z1231 Encounter for screening mammogram for malignant neoplasm of breast: Secondary | ICD-10-CM

## 2021-02-08 ENCOUNTER — Encounter: Payer: Managed Care, Other (non HMO) | Admitting: Family Medicine

## 2021-02-08 ENCOUNTER — Ambulatory Visit: Payer: Managed Care, Other (non HMO)

## 2021-02-24 ENCOUNTER — Encounter: Payer: Managed Care, Other (non HMO) | Admitting: Family Medicine

## 2021-06-16 ENCOUNTER — Encounter: Payer: Self-pay | Admitting: Student

## 2021-06-16 ENCOUNTER — Ambulatory Visit (INDEPENDENT_AMBULATORY_CARE_PROVIDER_SITE_OTHER): Payer: PRIVATE HEALTH INSURANCE

## 2021-06-16 ENCOUNTER — Ambulatory Visit (INDEPENDENT_AMBULATORY_CARE_PROVIDER_SITE_OTHER): Payer: PRIVATE HEALTH INSURANCE | Admitting: Student

## 2021-06-16 ENCOUNTER — Other Ambulatory Visit: Payer: Self-pay

## 2021-06-16 VITALS — BP 126/78 | HR 81 | Ht 66.0 in | Wt 196.5 lb

## 2021-06-16 DIAGNOSIS — Z23 Encounter for immunization: Secondary | ICD-10-CM | POA: Diagnosis not present

## 2021-06-16 DIAGNOSIS — I10 Essential (primary) hypertension: Secondary | ICD-10-CM | POA: Diagnosis not present

## 2021-06-16 DIAGNOSIS — Z1329 Encounter for screening for other suspected endocrine disorder: Secondary | ICD-10-CM | POA: Diagnosis not present

## 2021-06-16 DIAGNOSIS — Z130 Encounter for screening for diseases of the blood and blood-forming organs and certain disorders involving the immune mechanism: Secondary | ICD-10-CM | POA: Diagnosis not present

## 2021-06-16 DIAGNOSIS — R7303 Prediabetes: Secondary | ICD-10-CM

## 2021-06-16 DIAGNOSIS — Z13220 Encounter for screening for lipoid disorders: Secondary | ICD-10-CM

## 2021-06-16 DIAGNOSIS — Z0000 Encounter for general adult medical examination without abnormal findings: Secondary | ICD-10-CM

## 2021-06-16 DIAGNOSIS — M79672 Pain in left foot: Secondary | ICD-10-CM

## 2021-06-16 DIAGNOSIS — Z13228 Encounter for screening for other metabolic disorders: Secondary | ICD-10-CM

## 2021-06-16 LAB — POCT GLYCOSYLATED HEMOGLOBIN (HGB A1C): HbA1c, POC (prediabetic range): 6.1 % (ref 5.7–6.4)

## 2021-06-16 NOTE — Assessment & Plan Note (Addendum)
Well-controlled. BP 126/78 today in office. - Continue Amlodipine 10 mg daily - BMP obtained today

## 2021-06-16 NOTE — Patient Instructions (Addendum)
It was great seeing you today.  Today, you received your COVID and flu vaccines. Please follow up with the podiatrist for your foot pain. Your blood pressure looks great. Continue taking your blood pressure medication as prescribed.  We are checking some labs today. If anything is abnormal, I will call you.  We will plan to see you again next year unless you have any other concerns.   If you have any questions or concerns, please feel free to call the clinic.    Be well,  Dr. Darral Dash [facility] [phone]

## 2021-06-16 NOTE — Assessment & Plan Note (Signed)
A1c 6.1% today, starting to creep up from prior (5.8-6.0 over years). Discussed lifestyle and importance of diet and exercise. - Continue diet-control - Repeat in 6 months

## 2021-06-16 NOTE — Progress Notes (Signed)
° ° °  SUBJECTIVE:   Chief compliant/HPI: annual examination  Veronica Coleman is a 56 y.o. who presents today for an annual exam. Only concern is right medial foot pain, ongoing for years. She works at [employer] on her feet for long shifts and thinks it may be plantar fasciitis. Has followed in the past with podiatry and would like to see them again.  Healthcare Maintenance Due for annual influenza vaccination and COVID booster- would like to receive both today. Mammogram scheduled for 06/23/21 UTD on Colonoscopy- due in 2027 UTD on Pap smear- last in 2020: NILM and negative for high-risk HPV  HTN Takes Amlodipine 10mg  daily exactly as prescribed. No headache, chest pain, dizziness, visual disturbances. Does not check BP at home.  History tabs reviewed and updated .   PMH: Pre-DM, HTN, GERD  OBJECTIVE:   BP 126/78    Pulse 81    Ht 5\' 6"  (1.676 m)    Wt 196 lb 8 oz (89.1 kg)    SpO2 100%    BMI 31.72 kg/m   General: Well-appearing, in no distress HEENT: Pupils PERRL. EOMI. Good dentition. MMM. CV: Regular rate and rhythm, no murmurs Resp: Normal work of breathing on room air. Lungs clear in all fields to auscultation Abdomen: Obese, soft, non-tender, non-distended. No palpable masses. Skin: Warm, dry MSK: Right medial foot with mild swelling. No noticeable deformities in either foot. Normal ROM. Neuro: No focal deficits Psych: Normal mood and affect. Pleasant.  ASSESSMENT/PLAN:   Essential hypertension Well-controlled. BP 126/78 today in office. - Continue Amlodipine 10 mg daily - BMP obtained today  Prediabetes A1c 6.1% today, starting to creep up from prior (5.8-6.0 over years). Discussed lifestyle and importance of diet and exercise. - Continue diet-control - Repeat in 6 months   Annual Examination  PHQ score 1, reviewed and discussed.   Considered the following items based upon USPSTF recommendations: Diabetes screening: ordered Screening for elevated cholesterol:  ordered HIV testing: discussed Hepatitis C:  completed at last visit- negative.  Cervical cancer screening: prior Pap reviewed, repeat due in 2025 Breast cancer screening:  scheduled for 06/24/21. Colorectal cancer screening: up to date on screening for CRC., due in 2027 Vaccinations: yearly influenza and COVID booster administered today..   Follow up in 1 year or sooner if indicated.    Orvis Brill, [place]

## 2021-06-17 LAB — LIPID PANEL
Chol/HDL Ratio: 2.8 ratio (ref 0.0–4.4)
Cholesterol, Total: 160 mg/dL (ref 100–199)
HDL: 58 mg/dL (ref >39)
LDL Chol Calc (NIH): 92 mg/dL (ref 0–99)
Triglycerides: 47 mg/dL (ref 0–149)
VLDL Cholesterol Cal: 10 mg/dL (ref 5–40)

## 2021-06-17 LAB — BASIC METABOLIC PANEL
BUN/Creatinine Ratio: 16 (ref 9–23)
BUN: 10 mg/dL (ref 6–24)
CO2: 25 mmol/L (ref 20–29)
Calcium: 9.8 mg/dL (ref 8.7–10.2)
Chloride: 105 mmol/L (ref 96–106)
Creatinine, Ser: 0.63 mg/dL (ref 0.57–1.00)
Glucose: 81 mg/dL (ref 70–99)
Potassium: 4.7 mmol/L (ref 3.5–5.2)
Sodium: 143 mmol/L (ref 134–144)
eGFR: 105 mL/min/{1.73_m2} (ref >59)

## 2021-06-23 ENCOUNTER — Ambulatory Visit
Admission: RE | Admit: 2021-06-23 | Discharge: 2021-06-23 | Disposition: A | Discharge status: Home / Self Care | Payer: PRIVATE HEALTH INSURANCE | Source: Ambulatory Visit | Attending: Hematology and Oncology | Admitting: Hematology and Oncology

## 2021-06-23 ENCOUNTER — Other Ambulatory Visit: Payer: Self-pay

## 2021-06-23 ENCOUNTER — Ambulatory Visit: Payer: PRIVATE HEALTH INSURANCE | Admitting: Podiatry

## 2021-06-23 ENCOUNTER — Inpatient Hospital Stay: Admission: RE | Admit: 2021-06-23 | Payer: Self-pay | Source: Ambulatory Visit

## 2021-06-23 ENCOUNTER — Ambulatory Visit (INDEPENDENT_AMBULATORY_CARE_PROVIDER_SITE_OTHER): Payer: PRIVATE HEALTH INSURANCE

## 2021-06-23 DIAGNOSIS — M79672 Pain in left foot: Secondary | ICD-10-CM

## 2021-06-23 DIAGNOSIS — M79671 Pain in right foot: Secondary | ICD-10-CM | POA: Diagnosis not present

## 2021-06-23 DIAGNOSIS — M19079 Primary osteoarthritis, unspecified ankle and foot: Secondary | ICD-10-CM | POA: Diagnosis not present

## 2021-06-23 DIAGNOSIS — Z1231 Encounter for screening mammogram for malignant neoplasm of breast: Secondary | ICD-10-CM

## 2021-06-23 DIAGNOSIS — Q666 Other congenital valgus deformities of feet: Secondary | ICD-10-CM | POA: Diagnosis not present

## 2021-06-24 ENCOUNTER — Telehealth: Payer: Self-pay

## 2021-06-24 NOTE — Telephone Encounter (Signed)
Casts sent to [facility] [employer] ?

## 2021-06-25 ENCOUNTER — Encounter: Payer: Self-pay | Admitting: Podiatry

## 2021-06-25 NOTE — Progress Notes (Signed)
Subjective:  Patient ID: Veronica Coleman, female    DOB: [birth year],  MRN: [id number]  Chief Complaint  Patient presents with   Foot Pain    Pt came in today for bilateral foot pain in the arch of the foot  which started 10 years ago, pt has stabbing pain which she rates a 9 out of 10. Pt is standing for 10 hrs at work.     56 y.o. female presents with the above complaint.  Patient patient presents with complaint of bilateral foot pain.  Patient states this started 10-year ago pain in the arch she has been standing for 10 hours a day for work.  He pain he rates the pain 9 out of 10 stabbing pain he is tried orthotics icing.  She states that her previous orthotics are worn out she would like another pair of orthotics.  She has not seen anyone else prior to seeing me.  She would like to discuss treatment options for this.  She does not want injection.  Her pain scale is mild in nature.   Review of Systems: Negative except as noted in the HPI. Denies N/V/F/Ch.  Past Medical History:  Diagnosis Date   Environmental allergies    Hypertension     Current Outpatient Medications:    amLODipine (NORVASC) 10 MG tablet, Take 1 tablet (10 mg total) by mouth daily., Disp: 90 tablet, Rfl: 3   cephALEXin (KEFLEX) 500 MG capsule, Take 1 capsule (500 mg total) by mouth 2 (two) times daily. (Patient not taking: Reported on 06/23/2021), Disp: 14 capsule, Rfl: 0   omeprazole (PRILOSEC) 20 MG capsule, Take 1 capsule (20 mg total) by mouth daily., Disp: 30 capsule, Rfl: 3  Social History   Tobacco Use  Smoking Status Never  Smokeless Tobacco Never    No Known Allergies Objective:  There were no vitals filed for this visit. There is no height or weight on file to calculate BMI. Constitutional Well developed. Well nourished.  Vascular Dorsalis pedis pulses palpable bilaterally. Posterior tibial pulses palpable bilaterally. Capillary refill normal to all digits.  No cyanosis or clubbing noted. Pedal  hair growth normal.  Neurologic Normal speech. Oriented to person, place, and time. Epicritic sensation to light touch grossly present bilaterally.  Dermatologic Nails well groomed and normal in appearance. No open wounds. No skin lesions.  Orthopedic: Gait examination shows calcaneal valgus with too many toe signs partially recruit the arch with dorsiflexion of the hallux.  Unable to perform single double heel raise.  Pain at the talonavicular joint likely due to underlying arthritis.  I am able to o appreciate osteophytes.   Radiographs: 3 views of skeletally mature the right foot:Severe pes planovalgus noted with TN joint arthritis as well as midfoot collapse.  Left is greater than right side.  No other bony abnormalities identified. Assessment:   1. Pes planovalgus   2. Osteoarthritis of talonavicular joint    Plan:  Patient was evaluated and treated and all questions answered.  Talonavicular joint arthritis left greater than right side with underlying pes planovalgus -All questions and concerns were discussed with the patient in extensive detail.  Given that her pain is only mild in nature I believe she will benefit from plantar fascial brace especially on the left side since it is primarily the most painful 1 to give support to the arch while a new orthotic is being made.  I discussed this with the patient she states understand like to obtain brace -She was casted  for orthotics today in office due to underlying pes planovalgus deformity -If it continues to bother her or gets worse have asked her to come see me right away.  She states understanding.  No follow-ups on file.

## 2021-07-21 ENCOUNTER — Other Ambulatory Visit: Payer: PRIVATE HEALTH INSURANCE

## 2021-08-04 ENCOUNTER — Ambulatory Visit: Payer: PRIVATE HEALTH INSURANCE

## 2021-08-04 DIAGNOSIS — M19079 Primary osteoarthritis, unspecified ankle and foot: Secondary | ICD-10-CM

## 2021-08-04 DIAGNOSIS — Q666 Other congenital valgus deformities of feet: Secondary | ICD-10-CM

## 2021-08-04 NOTE — Progress Notes (Signed)
SITUATION: ?Reason for Visit: Fitting and Delivery of [employer] Fab[employer]ated Foot Orthoses ?Patient Report: Patient reports comfort and is satisfied with device. ? ?OBJECTIVE DATA: ?Patient History / Diagnosis:   ?  ICD-10-CM   ?1. Pes planovalgus  Q66.6   ?  ?2. Osteoarthritis of talonavicular joint  M19.079   ?  ? ? ?Provided Device:  Custom Functional Foot Orthotics ?    [employer]: [id number] ? ?GOAL OF ORTHOSIS ?- Improve gait ?- Decrease energy expenditure ?- Improve Balance ?- Provide Triplanar stability of foot complex ?- Facilitate motion ? ?ACTIONS PERFORMED ?Patient was fit with foot orthotics trimmed to shoe last. Patient tolerated fittign procedure.  ? ?Patient was provided with verbal and written instruction and demonstration regarding donning, doffing, wear, care, proper fit, function, purpose, cleaning, and use of the orthosis and in all related precautions and risks and benefits regarding the orthosis. ? ?Patient was also provided with verbal instruction regarding how to report any failures or malfunctions of the orthosis and necessary follow up care. Patient was also instructed to contact our office regarding any change in status that may affect the function of the orthosis. ? ?Patient demonstrated independence with proper donning, doffing, and fit and verbalized understanding of all instructions. ? ?PLAN: ?Patient is to follow up in one week or as necessary (PRN). All questions were answered and concerns addressed. Plan of care was discussed with and agreed upon by the patient. ? ?

## 2021-10-05 ENCOUNTER — Encounter: Payer: Self-pay | Admitting: *Deleted

## 2022-01-01 ENCOUNTER — Other Ambulatory Visit: Payer: Self-pay | Admitting: Family Medicine

## 2022-05-24 ENCOUNTER — Other Ambulatory Visit: Payer: Self-pay | Admitting: Family Medicine

## 2022-05-24 DIAGNOSIS — Z1231 Encounter for screening mammogram for malignant neoplasm of breast: Secondary | ICD-10-CM

## 2022-06-23 ENCOUNTER — Encounter: Payer: PRIVATE HEALTH INSURANCE | Admitting: Family Medicine

## 2022-07-04 ENCOUNTER — Ambulatory Visit (HOSPITAL_COMMUNITY)
Admission: EM | Admit: 2022-07-04 | Discharge: 2022-07-04 | Disposition: A | Discharge status: Home / Self Care | Payer: PRIVATE HEALTH INSURANCE | Attending: Family Medicine | Admitting: Family Medicine

## 2022-07-04 ENCOUNTER — Encounter (HOSPITAL_COMMUNITY): Payer: Self-pay

## 2022-07-04 DIAGNOSIS — Z1152 Encounter for screening for COVID-19: Secondary | ICD-10-CM | POA: Diagnosis not present

## 2022-07-04 DIAGNOSIS — R21 Rash and other nonspecific skin eruption: Secondary | ICD-10-CM

## 2022-07-04 DIAGNOSIS — J069 Acute upper respiratory infection, unspecified: Secondary | ICD-10-CM

## 2022-07-04 DIAGNOSIS — I10 Essential (primary) hypertension: Secondary | ICD-10-CM | POA: Diagnosis not present

## 2022-07-04 MED ORDER — PREDNISONE 20 MG PO TABS: 40.0000 mg | ORAL_TABLET | Freq: Every day | ORAL | 0 refills | Status: AC

## 2022-07-04 MED ORDER — BENZONATATE 100 MG PO CAPS: 100.0000 mg | ORAL_CAPSULE | Freq: Three times a day (TID) | ORAL | 0 refills | Status: DC | PRN

## 2022-07-04 NOTE — ED Provider Notes (Signed)
[place]    CSN: [id number] Arrival date & time: 07/04/22  1151      History   Chief Complaint Chief Complaint  Patient presents with   Rash    HPI Veronica Coleman is a 57 y.o. female.    Rash  Here for nasal congestion and cough.  She is also had some headache.  Those symptoms started on March 2.  Since March 1 she has had a very mildly pruritic rash on her arms.  No fever and no vomiting or diarrhea.  Past Medical History:  Diagnosis Date   Environmental allergies    Hypertension     Patient Active Problem List   Diagnosis Date Noted   Mechanical foot pain, left 01/02/2018   Prediabetes 12/07/2016   Annual physical exam 11/04/2015   Cystocele 12/24/2012   Obesity (BMI 30-39.9) 12/20/2011   Plantar fasciitis 11/01/2011   GERD (gastroesophageal reflux disease) 01/21/2010   Menopause 12/16/2008   Back pain 05/07/2007   Essential hypertension 09/18/2006   RHINITIS, ALLERGIC 06/29/2006    History reviewed. No pertinent surgical history.  OB History   No obstetric history on file.      Home Medications    Prior to Admission medications   Medication Sig Start Date End Date Taking? Authorizing Provider  amLODipine (NORVASC) 10 MG tablet Take 1 tablet by mouth once daily 01/04/22  Yes Alcus Dad, MD  benzonatate (TESSALON) 100 MG capsule Take 1 capsule (100 mg total) by mouth 3 (three) times daily as needed for cough. 07/04/22  Yes Barrett Henle, MD  predniSONE (DELTASONE) 20 MG tablet Take 2 tablets (40 mg total) by mouth daily with breakfast for 3 days. 07/04/22 07/07/22 Yes Barrett Henle, MD  omeprazole (PRILOSEC) 20 MG capsule Take 1 capsule (20 mg total) by mouth daily. 02/03/20   Alcus Dad, MD    Family History Family History  Problem Relation Age of Onset   Hypertension Mother    Asthma Son    Colon cancer Neg Hx    Breast cancer Neg Hx     Social History Social History   Tobacco Use   Smoking status: Never    Smokeless tobacco: Never  Substance Use Topics   Alcohol use: No   Drug use: No     Allergies   Patient has no known allergies.   Review of Systems Review of Systems  Skin:  Positive for rash.     Physical Exam Triage Vital Signs ED Triage Vitals  Enc Vitals Group     BP 07/04/22 1312 131/82     Pulse Rate 07/04/22 1312 86     Resp 07/04/22 1312 12     Temp 07/04/22 1312 98.2 F (36.8 C)     Temp Source 07/04/22 1312 Oral     SpO2 07/04/22 1312 97 %     Weight --      Height --      Head Circumference --      Peak Flow --      Pain Score 07/04/22 1311 0     Pain Loc --      Pain Edu? --      Excl. in [place]? --    No data found.  Updated Vital Signs BP 131/82 (BP Location: Left Arm)   Pulse 86   Temp 98.2 F (36.8 C) (Oral)   Resp 12   SpO2 97%   Visual Acuity Right Eye Distance:   Left Eye Distance:  Bilateral Distance:    Right Eye Near:   Left Eye Near:    Bilateral Near:     Physical Exam Vitals reviewed.  Constitutional:      General: She is not in acute distress.    Appearance: She is not toxic-appearing.  HENT:     Nose: Nose normal.     Mouth/Throat:     Mouth: Mucous membranes are moist.     Pharynx: No oropharyngeal exudate or posterior oropharyngeal erythema.  Eyes:     Extraocular Movements: Extraocular movements intact.     Conjunctiva/sclera: Conjunctivae normal.     Pupils: Pupils are equal, round, and reactive to light.  Cardiovascular:     Rate and Rhythm: Normal rate and regular rhythm.     Heart sounds: No murmur heard. Pulmonary:     Effort: Pulmonary effort is normal. No respiratory distress.     Breath sounds: No stridor. No wheezing, rhonchi or rales.  Musculoskeletal:     Cervical back: Neck supple.  Lymphadenopathy:     Cervical: No cervical adenopathy.  Skin:    Capillary Refill: Capillary refill takes less than 2 seconds.     Coloration: Skin is not jaundiced or pale.     Comments: Some faint mildly  erythematous maculopapular spots on her; no rash on face or trunk today  Neurological:     General: No focal deficit present.     Mental Status: She is alert and oriented to person, place, and time.  Psychiatric:        Behavior: Behavior normal.      UC Treatments / Results  Labs (all labs ordered are listed, but only abnormal results are displayed) Labs Reviewed  SARS CORONAVIRUS 2 (TAT 6-24 HRS)    EKG   Radiology No results found.  Procedures Procedures (including critical care time)  Medications Ordered in UC Medications - No data to display  Initial Impression / Assessment and Plan / UC Course  I have reviewed the triage vital signs and the nursing notes.  Pertinent labs & imaging results that were available during my care of the patient were reviewed by me and considered in my medical decision making (see chart for details).        She is swabbed for COVID today, and if positive she is a candidate for Paxlovid due to her hypertension.  eGFR was 105 last year.  Tessalon Perles are sent for the cough.  Prednisone is sent for 3 days to treat the allergic dermatitis.  This is unlikely to be related to measles with her not having any fever and the character of the rash. Final Clinical Impressions(s) / UC Diagnoses   Final diagnoses:  Acute upper respiratory infection  Rash     Discharge Instructions      Take benzonatate 100 mg, 1 tab every 8 hours as needed for cough.  Take prednisone 20 mg--2 daily for 3 days  You have been swabbed for COVID and staff will notify you if it is positive.      ED Prescriptions     Medication Sig Dispense Auth. Provider   predniSONE (DELTASONE) 20 MG tablet Take 2 tablets (40 mg total) by mouth daily with breakfast for 3 days. 6 tablet Tauren Delbuono, Gwenlyn Perking, MD   benzonatate (TESSALON) 100 MG capsule Take 1 capsule (100 mg total) by mouth 3 (three) times daily as needed for cough. 21 capsule Barrett Henle, MD       PDMP not reviewed this  encounter.   Barrett Henle, MD 07/04/22 [phone]

## 2022-07-04 NOTE — Discharge Instructions (Signed)
Take benzonatate 100 mg, 1 tab every 8 hours as needed for cough.  Take prednisone 20 mg--2 daily for 3 days  You have been swabbed for COVID and staff will notify you if it is positive.

## 2022-07-04 NOTE — ED Triage Notes (Signed)
Pt is here for rash, watery eyes , runny nose, sneezing, nasal congestion x 2days

## 2022-07-05 ENCOUNTER — Encounter: Payer: PRIVATE HEALTH INSURANCE | Admitting: Family Medicine

## 2022-07-05 LAB — SARS CORONAVIRUS 2 (TAT 6-24 HRS): SARS Coronavirus 2: NEGATIVE

## 2022-07-05 NOTE — Progress Notes (Deleted)
    SUBJECTIVE:   Chief complaint/HPI: annual examination  Veronica Coleman is a 57 y.o. who presents today for an annual exam.    History tabs reviewed and updated ***.   Review of systems form reviewed and notable for ***.   OBJECTIVE:   There were no vitals taken for this visit.  ***  ASSESSMENT/PLAN:   No problem-specific Assessment & Plan notes found for this encounter.    Annual Examination  See AVS for age appropriate recommendations  PHQ score ***, reviewed and discussed.  BP reviewed and at goal ***.  Asked about intimate partner violence and resources given as appropriate  Advance directives discussion ***  Considered the following items based upon USPSTF recommendations: Diabetes screening: {discussed/ordered:14545} Screening for elevated cholesterol: {discussed/ordered:14545} HIV testing:  negative in 2011- repeat not indicated Hepatitis C:  negative in 2021- repeat not indicated Syphilis if at high risk:  not at high risk GC/CT not at high risk and not ordered. Osteoporosis screening considered based upon risk of fracture from [facility] calculator. Major osteoporotic fracture risk is ***%. DEXA {ordered not order:23822}.  Reviewed risk factors for latent tuberculosis and {not indicated/requested/declined:14582}   Discussed family history, BRCA testing {not indicated/requested/declined:14582}. Tool used to risk stratify was ***.  Cervical cancer screening: prior Pap reviewed, repeat due in 2025. Breast cancer screening: reviewed prior mammogram from 2023- normal results. Has mammo scheduled later this month. Colorectal cancer screening: up to date on screening for CRC. Lung cancer screening:  not indicated .  Vaccinations ***.   Follow up in 1 *** year or sooner if indicated.    Alcus Dad, MD [place]

## 2022-07-13 ENCOUNTER — Ambulatory Visit
Admission: RE | Admit: 2022-07-13 | Discharge: 2022-07-13 | Disposition: A | Discharge status: Home / Self Care | Payer: PRIVATE HEALTH INSURANCE | Source: Ambulatory Visit | Attending: Podiatry | Admitting: Podiatry

## 2022-07-13 DIAGNOSIS — Z1231 Encounter for screening mammogram for malignant neoplasm of breast: Secondary | ICD-10-CM

## 2022-11-14 ENCOUNTER — Encounter (HOSPITAL_COMMUNITY): Payer: Self-pay | Admitting: Emergency Medicine

## 2022-11-14 ENCOUNTER — Ambulatory Visit (HOSPITAL_COMMUNITY)
Admission: EM | Admit: 2022-11-14 | Discharge: 2022-11-14 | Disposition: A | Discharge status: Home / Self Care | Payer: PRIVATE HEALTH INSURANCE | Attending: Nurse Practitioner | Admitting: Nurse Practitioner

## 2022-11-14 DIAGNOSIS — R1032 Left lower quadrant pain: Secondary | ICD-10-CM | POA: Diagnosis not present

## 2022-11-14 LAB — POCT URINALYSIS DIP (MANUAL ENTRY)
Bilirubin, UA: NEGATIVE
Blood, UA: NEGATIVE — AB
Glucose, UA: NEGATIVE mg/dL
Ketones, POC UA: NEGATIVE mg/dL
Nitrite, UA: NEGATIVE
Protein Ur, POC: NEGATIVE mg/dL
Spec Grav, UA: 1.025 (ref 1.010–1.025)
Urobilinogen, UA: 0.2 E.U./dL
pH, UA: 7.5 (ref 5.0–8.0)

## 2022-11-14 MED ORDER — AMOXICILLIN-POT CLAVULANATE 875-125 MG PO TABS: 1.0000 | ORAL_TABLET | Freq: Two times a day (BID) | ORAL | 0 refills | Status: AC

## 2022-11-14 NOTE — ED Triage Notes (Signed)
Pt c/o LLQ pains for a couple days. Denies n/v, urinary or bowel problems. Last BM today

## 2022-11-14 NOTE — Discharge Instructions (Signed)
The pain in your abdomen may be coming from diverticulitis.  Please take the Augmentin as prescribed to treat it.  For the next 48 hours, recommend drinking and eating only clear liquids-water, sugar-free electrolytes, Jell-O, broth without any solid pieces, etc.  After 48 hours and as long as the pain has improved, you can eat a full liquid diet to include pudding, applesauce, mashed bananas, other mashed up foods that are soft and easy to digest.  Do this for 24 hours or so.  As long as pain continues to improve, you can continue to advance your diet after that to your normal diet.  If you develop severe pain or blood in your vomit or stool and are unable to keep food or fluids down, please go to the emergency room.  If symptoms persist despite treatment, recommend follow-up with PCP.

## 2022-11-14 NOTE — ED Provider Notes (Signed)
[facility]    CSN: [id number] Arrival date & time: 11/14/22  8657      History   Chief Complaint Chief Complaint  Patient presents with   Abdominal Pain    HPI Veronica Coleman is a 57 y.o. female.   Patient presents today with left lower quadrant abdominal pain for the past couple of days.  She denies known fevers, nausea/vomiting, constipation, dysuria, urinary frequency, hematuria, voiding smaller amounts.  No vaginal discharge, change in appetite, new rash, or heartburn/indigestion.  No recent dietary changes or changes in medicines or supplements.  Has not taken anything for symptoms so far.  Reports her bowel movements have been a little bit more soft than normal, but they are not watery diarrhea.  No blood in the stool.    Past Medical History:  Diagnosis Date   Environmental allergies    Hypertension     Patient Active Problem List   Diagnosis Date Noted   Mechanical foot pain, left 01/02/2018   Prediabetes 12/07/2016   Annual physical exam 11/04/2015   Cystocele 12/24/2012   Obesity (BMI 30-39.9) 12/20/2011   Plantar fasciitis 11/01/2011   GERD (gastroesophageal reflux disease) 01/21/2010   Menopause 12/16/2008   Back pain 05/07/2007   Essential hypertension 09/18/2006   RHINITIS, ALLERGIC 06/29/2006    History reviewed. No pertinent surgical history.  OB History   No obstetric history on file.      Home Medications    Prior to Admission medications   Medication Sig Start Date End Date Taking? Authorizing Provider  amoxicillin-clavulanate (AUGMENTIN) 875-125 MG tablet Take 1 tablet by mouth 2 (two) times daily for 7 days. 11/14/22 11/21/22 Yes Valentino Nose, NP  amLODipine (NORVASC) 10 MG tablet Take 1 tablet by mouth once daily 01/04/22   Maury Dus, MD  omeprazole (PRILOSEC) 20 MG capsule Take 1 capsule (20 mg total) by mouth daily. 02/03/20   Maury Dus, MD    Family History Family History  Problem Relation Age of Onset    Hypertension Mother    Asthma Son    Colon cancer Neg Hx    Breast cancer Neg Hx     Social History Social History   Tobacco Use   Smoking status: Never   Smokeless tobacco: Never  Substance Use Topics   Alcohol use: No   Drug use: No     Allergies   Patient has no known allergies.   Review of Systems Review of Systems Per HPI  Physical Exam Triage Vital Signs ED Triage Vitals  Encounter Vitals Group     BP 11/14/22 1110 128/85     Systolic BP Percentile --      Diastolic BP Percentile --      Pulse Rate 11/14/22 1110 88     Resp 11/14/22 1110 16     Temp --      Temp Source 11/14/22 1110 Oral     SpO2 11/14/22 1110 97 %     Weight --      Height --      Head Circumference --      Peak Flow --      Pain Score 11/14/22 1109 10     Pain Loc --      Pain Education --      Exclude from Growth Chart --    No data found.  Updated Vital Signs BP 128/85 (BP Location: Right Arm)   Pulse 88   Resp 16   SpO2 97%  Visual Acuity Right Eye Distance:   Left Eye Distance:   Bilateral Distance:    Right Eye Near:   Left Eye Near:    Bilateral Near:     Physical Exam Vitals and nursing note reviewed.  Constitutional:      General: She is not in acute distress.    Appearance: Normal appearance. She is not toxic-appearing.  HENT:     Head: Normocephalic and atraumatic.     Mouth/Throat:     Mouth: Mucous membranes are moist.     Pharynx: Oropharynx is clear.  Eyes:     General: No scleral icterus.    Extraocular Movements: Extraocular movements intact.  Cardiovascular:     Rate and Rhythm: Regular rhythm. Tachycardia present.  Pulmonary:     Effort: Pulmonary effort is normal. No respiratory distress.     Breath sounds: Normal breath sounds. No wheezing, rhonchi or rales.  Abdominal:     General: Abdomen is flat. Bowel sounds are normal. There is no distension.     Palpations: Abdomen is soft.     Tenderness: There is abdominal tenderness in the  left lower quadrant. There is no right CVA tenderness, left CVA tenderness, guarding or rebound. Negative signs include Murphy's sign, Rovsing's sign and McBurney's sign.  Musculoskeletal:     Cervical back: Normal range of motion.  Lymphadenopathy:     Cervical: No cervical adenopathy.  Skin:    General: Skin is warm and dry.     Capillary Refill: Capillary refill takes less than 2 seconds.     Coloration: Skin is not jaundiced or pale.     Findings: No erythema.  Neurological:     Mental Status: She is alert and oriented to person, place, and time.  Psychiatric:        Behavior: Behavior is cooperative.      UC Treatments / Results  Labs (all labs ordered are listed, but only abnormal results are displayed) Labs Reviewed  POCT URINALYSIS DIP (MANUAL ENTRY) - Abnormal; Notable for the following components:      Result Value   Blood, UA negative (*)    Leukocytes, UA Small (1+) (*)    All other components within normal limits    EKG   Radiology No results found.  Procedures Procedures (including critical care time)  Medications Ordered in UC Medications - No data to display  Initial Impression / Assessment and Plan / UC Course  I have reviewed the triage vital signs and the nursing notes.  Pertinent labs & imaging results that were available during my care of the patient were reviewed by me and considered in my medical decision making (see chart for details).   1. LLQ pain Vitals and exam today reassuring-no guarding on abdominal exam Urinalysis is without signs of acute infection or kidney stones; no CVA tenderness on examination Mildly suspicious for diverticulitis Treat with Augmentin twice daily for 7 days, start clear liquid diet and advance to full's if abdominal pain improved thereafter Recommended follow-up with PCP if symptoms do not improve with this treatment Strict ER precautions discussed with patient  The patient was given the opportunity to ask  questions.  All questions answered to their satisfaction.  The patient is in agreement to this plan.    Final Clinical Impressions(s) / UC Diagnoses   Final diagnoses:  LLQ pain     Discharge Instructions      The pain in your abdomen may be coming from diverticulitis.  Please take  the Augmentin as prescribed to treat it.  For the next 48 hours, recommend drinking and eating only clear liquids-water, sugar-free electrolytes, Jell-O, broth without any solid pieces, etc.  After 48 hours and as long as the pain has improved, you can eat a full liquid diet to include pudding, applesauce, mashed bananas, other mashed up foods that are soft and easy to digest.  Do this for 24 hours or so.  As long as pain continues to improve, you can continue to advance your diet after that to your normal diet.  If you develop severe pain or blood in your vomit or stool and are unable to keep food or fluids down, please go to the emergency room.  If symptoms persist despite treatment, recommend follow-up with PCP.    ED Prescriptions     Medication Sig Dispense Auth. Provider   amoxicillin-clavulanate (AUGMENTIN) 875-125 MG tablet Take 1 tablet by mouth 2 (two) times daily for 7 days. 14 tablet Valentino Nose, NP      PDMP not reviewed this encounter.   Valentino Nose, NP 11/14/22 1445

## 2022-11-23 ENCOUNTER — Other Ambulatory Visit (HOSPITAL_COMMUNITY)
Admission: RE | Admit: 2022-11-23 | Discharge: 2022-11-23 | Disposition: A | Discharge status: Home / Self Care | Payer: PRIVATE HEALTH INSURANCE | Source: Ambulatory Visit | Attending: Family Medicine | Admitting: Family Medicine

## 2022-11-23 ENCOUNTER — Ambulatory Visit (INDEPENDENT_AMBULATORY_CARE_PROVIDER_SITE_OTHER): Payer: PRIVATE HEALTH INSURANCE | Admitting: Family Medicine

## 2022-11-23 ENCOUNTER — Encounter: Payer: Self-pay | Admitting: Family Medicine

## 2022-11-23 VITALS — BP 121/78 | HR 70 | Ht 66.0 in | Wt 189.4 lb

## 2022-11-23 DIAGNOSIS — R300 Dysuria: Secondary | ICD-10-CM | POA: Insufficient documentation

## 2022-11-23 LAB — POCT WET PREP (WET MOUNT)
Clue Cells Wet Prep Whiff POC: NEGATIVE
Trichomonas Wet Prep HPF POC: ABSENT

## 2022-11-23 LAB — POCT UA - MICROSCOPIC ONLY: WBC, Ur, HPF, POC: NONE SEEN (ref 0–5)

## 2022-11-23 NOTE — Assessment & Plan Note (Addendum)
Given symptoms and recent course of antibiotics tested patient for bacterial UTI, vaginal yeast infection BV GC chlamydia and trichomonas.  Low suspicion for STI at this time.  All tests returned negative suspect the symptoms are due to somewhat impressive cystocele seen on vaginal exam.  Patient has scheduled follow-up for this issue with her primary doctor.  If test returns positive we will call in appropriate medication.

## 2022-11-23 NOTE — Patient Instructions (Signed)
It was wonderful to see you today.  Today we talked about:  Your recent trip to urgent care.  It sounds like the symptoms you are having then are resolving.  We did discuss things to watch out for as far as diverticulitis including fevers, chills, pain in your abdomen so bad that you do not want to touch it, and bright red blood in the toilet bowl and on your poop.  For the urinary burning and frequency you have been having we did a urine test to look for bacteria as well as a swab in your vagina to look for yeast and bacterial overgrowth.  Those results should be ready sometime later today, and if needed we will call you in an antibiotic to your pharmacy.  On your exam I also noticed that you have a cystocele which means that your bladder is bulging into your vagina.  This could be causing your symptoms as well you should make a follow-up appointment with your primary doctor sometime in the next few weeks to discuss this problem further.  Thank you for choosing [facility].   Please call [phone] with any questions about today's appointment.  Please arrive at least 15 minutes prior to your scheduled appointments.   If you had blood work today, I will send you a MyChart message or a letter if results are normal. Otherwise, I will give you a call.   If you had a referral placed, they will call you to set up an appointment. Please give [place] a call if you don't hear back in the next 2 weeks.   If you need additional refills before your next appointment, please call your pharmacy first.   Gerrit Heck, DO Family Medicine

## 2022-11-23 NOTE — Progress Notes (Signed)
    SUBJECTIVE:   CHIEF COMPLAINT / HPI:   57 yo female seen in urgent care for LLQ pain on 7/15. Vitals and physical exam were reassuring at that time, given amoxicillin clavulanate with some improvement but not fully better  Also having some vaginal itching for the last 2 or 3 days. Urgency and frequency with urination. No odor or change in vaginal discharge. No issues with sexual intercourse or vaginal dryness.   PERTINENT  PMH / PSH: none  OBJECTIVE:   BP 121/78   Pulse 70   Ht 5\' 6"  (1.676 m)   Wt 189 lb 6.4 oz (85.9 kg)   SpO2 98%   BMI 30.57 kg/m   Abdominal: Soft, nontender, nondistended.  Bowel sounds present x 4.  Only mild tenderness to palpation with deep palpation in left lower quadrant.  McBurney sign negative GU exam: Cleatrice Burke present as Nurse, [facility].  Normal external vaginal anatomy.  Anterior vaginal wall bulging into vaginal introitus.  Internal vaginal mucosa moist and pink.  ASSESSMENT/PLAN:   Burning with urination Given symptoms and recent course of antibiotics tested patient for bacterial UTI, vaginal yeast infection BV GC chlamydia and trichomonas.  Low suspicion for STI at this time.  All tests returned negative suspect the symptoms are due to somewhat impressive cystocele seen on vaginal exam.  Patient has scheduled follow-up for this issue with her primary doctor.  If test returns positive we will call in appropriate medication.     Gerrit Heck, DO [facility]

## 2022-12-02 ENCOUNTER — Other Ambulatory Visit: Payer: Self-pay

## 2022-12-03 MED ORDER — AMLODIPINE BESYLATE 10 MG PO TABS: 10.0000 mg | ORAL_TABLET | Freq: Every day | ORAL | 1 refills | Status: DC

## 2022-12-06 ENCOUNTER — Encounter: Payer: Self-pay | Admitting: Student

## 2022-12-06 ENCOUNTER — Ambulatory Visit: Payer: PRIVATE HEALTH INSURANCE | Admitting: Student

## 2022-12-06 VITALS — BP 119/76 | HR 79 | Ht 66.0 in | Wt 190.4 lb

## 2022-12-06 DIAGNOSIS — K5900 Constipation, unspecified: Secondary | ICD-10-CM

## 2022-12-06 DIAGNOSIS — Z0000 Encounter for general adult medical examination without abnormal findings: Secondary | ICD-10-CM | POA: Diagnosis not present

## 2022-12-06 DIAGNOSIS — R7303 Prediabetes: Secondary | ICD-10-CM

## 2022-12-06 DIAGNOSIS — I10 Essential (primary) hypertension: Secondary | ICD-10-CM

## 2022-12-06 DIAGNOSIS — Z23 Encounter for immunization: Secondary | ICD-10-CM

## 2022-12-06 LAB — POCT GLYCOSYLATED HEMOGLOBIN (HGB A1C): HbA1c, POC (prediabetic range): 6 % (ref 5.7–6.4)

## 2022-12-06 MED ORDER — POLYETHYLENE GLYCOL 3350 17 GM/SCOOP PO POWD: 17.0000 g | Freq: Two times a day (BID) | ORAL | 0 refills | Status: DC | PRN

## 2022-12-06 NOTE — Patient Instructions (Addendum)
It was great to see you! Thank you for allowing me to participate in your care!   I recommend that you always bring your medications to each appointment as this makes it easy to ensure we are on the correct medications and helps [place] not miss when refills are needed.  Our plans for today:  -Use 1 cap of MiraLAX, twice daily until you have soft bowel movements -Follow-up in 2 weeks to discuss LLQ pain  We are checking some labs today, I will call you if they are abnormal will send you a MyChart message or a letter if they are normal.  If you do not hear about your labs in the next 2 weeks please let us know.  Take care and seek immediate care sooner if you develop any concerns. Please remember to show up 15 minutes before your scheduled appointment time!  Tiffany Kocher, DO [facility]

## 2022-12-06 NOTE — Progress Notes (Signed)
    SUBJECTIVE:   Chief compliant/HPI: annual examination  Veronica Coleman is a 57 y.o. who presents today for an annual exam.   History and medication tabs reviewed and updated.   Review of systems form reviewed and notable for constipation.   OBJECTIVE:   BP 119/76   Pulse 79   Ht 5\' 6"  (1.676 m)   Wt 190 lb 6 oz (86.4 kg)   SpO2 98%   BMI 30.73 kg/m    General: NAD, pleasant HEENT: Normocephalic, atraumatic head. Normal external ear bilaterally. EOM intact and normal conjunctiva BL. Normal external nose. Cardio: RRR, no MRG. Respiratory: CTAB, normal wob on RA Skin: Warm and dry   ASSESSMENT/PLAN:   Annual Examination  See AVS for age appropriate recommendations  PHQ score 0, reviewed and discussed.  BP reviewed and at goal. BMP ordered. Advance directives discussion, not interested today.  Considered the following items based upon USPSTF recommendations: Diabetes screening: ordered Screening for elevated cholesterol: discussed HIV testing:  Previously done Hepatitis C:  Previously done GC/CT  negative last visit Osteoporosis screening not indicated at this time  Cervical cancer screening: prior Pap reviewed, repeat due in September 2025 Breast cancer screening:  UTD on mammogram Colorectal cancer screening: up to date on screening for CRC. Lung cancer screening:  Not indicated . Vaccinations: TDaP given today  Follow up in 1 year or sooner if indicated.   Constipation 1 to 2 weeks of less frequent and hard stools. Still having bowel movements. No vomiting. Good PO intake. UTD on colonoscopy. UA negative for UTI at lat visit.  Well-appearing today. - MiraLAX twice daily as needed - Follow-up in 2 weeks  Follow-up recommendations Discuss generalized LLQ pain.   Tiffany Kocher, DO [facility]

## 2022-12-20 ENCOUNTER — Ambulatory Visit: Payer: Self-pay | Admitting: Student

## 2023-01-31 ENCOUNTER — Ambulatory Visit: Payer: PRIVATE HEALTH INSURANCE | Admitting: Student

## 2023-02-15 ENCOUNTER — Encounter: Payer: Self-pay | Admitting: Student

## 2023-02-15 ENCOUNTER — Ambulatory Visit: Payer: PRIVATE HEALTH INSURANCE | Admitting: Student

## 2023-02-15 VITALS — BP 120/70 | HR 78 | Ht 66.0 in | Wt 190.5 lb

## 2023-02-15 DIAGNOSIS — N814 Uterovaginal prolapse, unspecified: Secondary | ICD-10-CM

## 2023-02-15 DIAGNOSIS — R1032 Left lower quadrant pain: Secondary | ICD-10-CM

## 2023-02-15 DIAGNOSIS — Z23 Encounter for immunization: Secondary | ICD-10-CM | POA: Diagnosis not present

## 2023-02-15 NOTE — Progress Notes (Signed)
    SUBJECTIVE:   CHIEF COMPLAINT / HPI:   LLQ pain  Cystocele Left lower quadrant pain is resolved, with intermittent use of MiraLAX.  Cystocele is asymptomatic, does not bother the patient.  She has no difficulty urinating, no dysuria, no vaginal discomfort.  She is interested in seeing a specialist to discuss treatment options.  OBJECTIVE:   BP (!) 129/93   Pulse 78   Ht 5\' 6"  (1.676 m)   Wt 190 lb 8 oz (86.4 kg)   SpO2 100%   BMI 30.75 kg/m    General: NAD, pleasant Cardio: RRR, no MRG. Cap Refill <2s. Respiratory: CTAB, normal wob on RA GI: Abdomen is soft, not tender, not distended. BS present Skin: Warm and dry  ASSESSMENT/PLAN:   Assessment & Plan Cystocele with prolapse Asymptomatic. Referral to GYN to discuss treatment options. LLQ pain Resolved. Continue PRN miralax  Tiffany Kocher, DO [facility]

## 2023-02-15 NOTE — Patient Instructions (Addendum)
It was great to see you! Thank you for allowing me to participate in your care!   I recommend that you always bring your medications to each appointment as this makes it easy to ensure we are on the correct medications and helps [place] not miss when refills are needed.  Our plans for today:  - I have sent a referral to Gynecology for vaginal prolapse - Follow-up in 1 year or sooner if needed  Take care and seek immediate care sooner if you develop any concerns. Please remember to show up 15 minutes before your scheduled appointment time!  Tiffany Kocher, DO [facility]

## 2023-04-03 ENCOUNTER — Telehealth: Payer: Self-pay

## 2023-04-03 DIAGNOSIS — N814 Uterovaginal prolapse, unspecified: Secondary | ICD-10-CM

## 2023-04-03 NOTE — Telephone Encounter (Signed)
Patient calls nurse line requesting an update on GYN referral.   Per chart review, referral was placed on 10/16, however it was denied.   Per referral notes they are suggesting a referral to Urogynecology.  Will forward to PCP to place.

## 2023-04-03 NOTE — Telephone Encounter (Signed)
New referral placed.

## 2023-04-07 ENCOUNTER — Ambulatory Visit (HOSPITAL_COMMUNITY)
Admission: EM | Admit: 2023-04-07 | Discharge: 2023-04-07 | Disposition: A | Discharge status: Home / Self Care | Payer: PRIVATE HEALTH INSURANCE | Attending: Emergency Medicine | Admitting: Emergency Medicine

## 2023-04-07 ENCOUNTER — Encounter (HOSPITAL_COMMUNITY): Payer: Self-pay | Admitting: *Deleted

## 2023-04-07 DIAGNOSIS — N814 Uterovaginal prolapse, unspecified: Secondary | ICD-10-CM | POA: Diagnosis not present

## 2023-04-07 HISTORY — DX: Uterovaginal prolapse, unspecified: N81.4

## 2023-04-07 LAB — POCT URINALYSIS DIP (MANUAL ENTRY)
Bilirubin, UA: NEGATIVE
Blood, UA: NEGATIVE
Glucose, UA: NEGATIVE mg/dL
Nitrite, UA: NEGATIVE
Protein Ur, POC: NEGATIVE mg/dL
Spec Grav, UA: 1.025 (ref 1.010–1.025)
Urobilinogen, UA: 0.2 U/dL
pH, UA: 7 (ref 5.0–8.0)

## 2023-04-07 NOTE — ED Provider Notes (Signed)
[facility]    CSN: [id number] Arrival date & time: 04/07/23  1804      History   Chief Complaint No chief complaint on file.   HPI Veronica Coleman is a 57 y.o. female.   Patient presents to clinic for complaints of urinary frequency.  She also feels like her vagina is closing up on her.  On further evaluation she has a cystocele with prolapse.  Initially her primary care provider referred her to gynecology in October, this referral was canceled.  A referral to urology was placed recently on December 2nd.  Patient reports she has not yet heard back from the urologist.  Reports she has the urge to urinate after drinking water.  She has not had any changes to her vaginal discharge recently.  No fever, flank pain, nausea or vomiting.  The history is provided by the patient and medical records.    Past Medical History:  Diagnosis Date   Cystocele with prolapse    Environmental allergies    Hypertension     Patient Active Problem List   Diagnosis Date Noted   Burning with urination 11/23/2022   Mechanical foot pain, left 01/02/2018   Prediabetes 12/07/2016   Annual physical exam 11/04/2015   Cystocele 12/24/2012   Obesity (BMI 30-39.9) 12/20/2011   Plantar fasciitis 11/01/2011   GERD (gastroesophageal reflux disease) 01/21/2010   Menopause 12/16/2008   Back pain 05/07/2007   Essential hypertension 09/18/2006   Allergic rhinitis 06/29/2006    History reviewed. No pertinent surgical history.  OB History   No obstetric history on file.      Home Medications    Prior to Admission medications   Medication Sig Start Date End Date Taking? Authorizing Provider  amLODipine (NORVASC) 10 MG tablet Take 1 tablet (10 mg total) by mouth daily. 12/03/22  Yes Tiffany Kocher, DO  polyethylene glycol powder (GLYCOLAX/MIRALAX) 17 GM/SCOOP powder Take 17 g by mouth 2 (two) times daily as needed. Continue to use until you have soft stools. 12/06/22   Tiffany Kocher, DO     Family History Family History  Problem Relation Age of Onset   Hypertension Mother    Asthma Son    Colon cancer Neg Hx    Breast cancer Neg Hx     Social History Social History   Tobacco Use   Smoking status: Never   Smokeless tobacco: Never  Vaping Use   Vaping status: Never Used  Substance Use Topics   Alcohol use: No   Drug use: No     Allergies   Patient has no known allergies.   Review of Systems Review of Systems  Per HPI   Physical Exam Triage Vital Signs ED Triage Vitals  Encounter Vitals Group     BP 04/07/23 1934 (!) 160/89     Systolic BP Percentile --      Diastolic BP Percentile --      Pulse Rate 04/07/23 1934 85     Resp 04/07/23 1934 18     Temp 04/07/23 1934 98.8 F (37.1 C)     Temp Source 04/07/23 1934 Oral     SpO2 04/07/23 1934 98 %     Weight --      Height --      Head Circumference --      Peak Flow --      Pain Score 04/07/23 1932 0     Pain Loc --      Pain Education --  Exclude from Growth Chart --    No data found.  Updated Vital Signs BP (!) 160/89 (BP Location: Left Arm)   Pulse 85   Temp 98.8 F (37.1 C) (Oral)   Resp 18   SpO2 98%   Visual Acuity Right Eye Distance:   Left Eye Distance:   Bilateral Distance:    Right Eye Near:   Left Eye Near:    Bilateral Near:     Physical Exam Vitals and nursing note reviewed.  Constitutional:      Appearance: Normal appearance.  HENT:     Head: Normocephalic and atraumatic.     Right Ear: External ear normal.     Left Ear: External ear normal.     Nose: Nose normal.     Mouth/Throat:     Mouth: Mucous membranes are moist.  Eyes:     Conjunctiva/sclera: Conjunctivae normal.  Cardiovascular:     Rate and Rhythm: Normal rate.  Pulmonary:     Effort: Pulmonary effort is normal. No respiratory distress.  Musculoskeletal:        General: Normal range of motion.  Neurological:     General: No focal deficit present.     Mental Status: She is alert.   Psychiatric:        Mood and Affect: Mood normal.      UC Treatments / Results  Labs (all labs ordered are listed, but only abnormal results are displayed) Labs Reviewed  POCT URINALYSIS DIP (MANUAL ENTRY) - Abnormal; Notable for the following components:      Result Value   Ketones, POC UA trace (5) (*)    Leukocytes, UA Small (1+) (*)    All other components within normal limits    EKG   Radiology No results found.  Procedures Procedures (including critical care time)  Medications Ordered in UC Medications - No data to display  Initial Impression / Assessment and Plan / UC Course  I have reviewed the triage vital signs and the nursing notes.  Pertinent labs & imaging results that were available during my care of the patient were reviewed by me and considered in my medical decision making (see chart for details).  Vitals and triage reviewed, patient is hemodynamically stable.  Her urine shows small leukocytes, she is not having any dysuria, fevers, flank pain, nausea or vomiting.  Low concern for pyelonephritis or UTI at this time.  Suspect she is symptomatic from her cystocele with prolapse.  Encourage patient to follow-up with PCP regarding urology referral for further management.  Plan of care, follow-up care and return precautions given, no questions at this time.     Final Clinical Impressions(s) / UC Diagnoses   Final diagnoses:  Cystocele with prolapse     Discharge Instructions      Please call your primary care provider on Monday to get an update on the referral status for urology.  If you do not hear anything you can consider calling [facility] for an appointment.  I believe all your symptoms are from your cystocele, as your urine did not show signs of urinary tract infection today in clinic.  Ensure you are drinking plenty of water throughout the day.    ED Prescriptions   None    PDMP not reviewed this encounter.   Oshea Percival, [place],  [place] 04/07/23 [phone]

## 2023-04-07 NOTE — Discharge Instructions (Signed)
Please call your primary care provider on Monday to get an update on the referral status for urology.  If you do not hear anything you can consider calling [facility] for an appointment.  I believe all your symptoms are from your cystocele, as your urine did not show signs of urinary tract infection today in clinic.  Ensure you are drinking plenty of water throughout the day.

## 2023-04-07 NOTE — ED Triage Notes (Signed)
Pt states her vagina is closing up and she has a referral to GYN but its been cancelled and now there is a referral to urology which is pending.    She states she is having to urinate a lot and having some discharge.

## 2023-06-20 ENCOUNTER — Other Ambulatory Visit: Payer: Self-pay | Admitting: Student

## 2023-07-18 ENCOUNTER — Other Ambulatory Visit: Payer: Self-pay

## 2023-07-18 DIAGNOSIS — Z1231 Encounter for screening mammogram for malignant neoplasm of breast: Secondary | ICD-10-CM

## 2023-08-02 ENCOUNTER — Ambulatory Visit
Admission: RE | Admit: 2023-08-02 | Discharge: 2023-08-02 | Disposition: A | Discharge status: Home / Self Care | Payer: PRIVATE HEALTH INSURANCE | Source: Ambulatory Visit | Attending: Oral Surgery | Admitting: Oral Surgery

## 2023-08-02 DIAGNOSIS — Z1231 Encounter for screening mammogram for malignant neoplasm of breast: Secondary | ICD-10-CM

## 2023-08-23 ENCOUNTER — Ambulatory Visit (INDEPENDENT_AMBULATORY_CARE_PROVIDER_SITE_OTHER): Payer: PRIVATE HEALTH INSURANCE | Admitting: Obstetrics & Gynecology

## 2023-08-23 ENCOUNTER — Other Ambulatory Visit (HOSPITAL_COMMUNITY)
Admission: RE | Admit: 2023-08-23 | Discharge: 2023-08-23 | Disposition: A | Discharge status: Home / Self Care | Payer: PRIVATE HEALTH INSURANCE | Source: Ambulatory Visit | Attending: Obstetrics & Gynecology | Admitting: Obstetrics & Gynecology

## 2023-08-23 ENCOUNTER — Encounter: Payer: Self-pay | Admitting: Obstetrics & Gynecology

## 2023-08-23 VITALS — BP 171/91 | HR 73 | Ht 66.0 in | Wt 186.2 lb

## 2023-08-23 DIAGNOSIS — Z124 Encounter for screening for malignant neoplasm of cervix: Secondary | ICD-10-CM | POA: Diagnosis present

## 2023-08-23 DIAGNOSIS — N813 Complete uterovaginal prolapse: Secondary | ICD-10-CM

## 2023-08-23 NOTE — Progress Notes (Signed)
   GYN VISIT Patient name: Veronica Coleman MRN [id number]  Date of birth: 1965/07/04 Chief Complaint:   Vaginal Prolapse  History of Present Illness:   Veronica Coleman is a 58 y.o. G66P3003 PM female being seen today for the following concerns:  Prolapse: She feels a bulge vaginally when she uses the rest room and wipes.  Noted the bulge about 2 weeks.  Seems as though this issue has gotten worse recently due to constipation.  No vaginal bleeding, discharge, itching or irritation.    Denies splinting though possible notes positional change for complete voiding.  Denies issues with urination or BM.    Denies pelvic or abdominal pain..     No LMP recorded. Patient is postmenopausal.    Review of Systems:   Pertinent items are noted in HPI Denies fever/chills, dizziness, headaches, visual disturbances, fatigue, shortness of breath, chest pain, abdominal pain, vomiting. Pertinent History Reviewed:  NSVD x 3  Past Medical History:  Diagnosis Date   Cystocele with prolapse    Environmental allergies    Hypertension    Reviewed problem list, medications and allergies. Physical Assessment:   Vitals:   08/23/23 1126 08/23/23 1147  BP: (!) 151/93 (!) 171/91  Pulse: 77 73  Weight: 186 lb 3.2 oz (84.5 kg)   Height: 5\' 6"  (1.676 m)   Body mass index is 30.05 kg/m.       Physical Examination:   General appearance: alert, well appearing, and in no distress  Psych: mood appropriate, normal affect  Skin: warm & dry   Cardiovascular: normal heart rate noted  Respiratory: normal respiratory effort, no distress  Abdomen: soft, non-tender, no rebound, no guarding  Pelvic: uterine prolapse visible on initial exam, pap collected without insertion of speculum.  Normal external genitalia, vaginal pink mucosa.  No uterine or adnexal masses palpated on bimanual exam stage III uterine prolapse with cystocele identified  Extremities: no edema   Chaperone:  pt declined     Assessment & Plan:  1)  Preventive screening - Pap collected, reviewed screening guidelines - Per patient mammogram up to date, last completed 08/2023  2) Uterine prolapse with cystocele - Discussed findings on exam and reviewed how constipation may cause significant worsening of her symptoms.  Reviewed conservative options to help prevent this - Discussed pessary versus surgical intervention - Discussed that based on degree of prolapse, surgery may be a better option -Referral to urogyn -pt plans to review her options with husband as well  Orders Placed This Encounter  Procedures   Ambulatory referral to Urogynecology      Laasya Peyton, DO Attending Obstetrician & Gynecologist, [facility] for [facility], [facility]

## 2023-08-29 LAB — CYTOLOGY - PAP
Comment: NEGATIVE
Comment: NEGATIVE
Comment: NEGATIVE
HPV 16: NEGATIVE
HPV 18 / 45: NEGATIVE
High risk HPV: POSITIVE — AB

## 2023-08-30 ENCOUNTER — Encounter: Payer: Self-pay | Admitting: Obstetrics & Gynecology

## 2023-09-06 ENCOUNTER — Encounter: Payer: Self-pay | Admitting: Obstetrics & Gynecology

## 2023-09-06 ENCOUNTER — Encounter: Payer: PRIVATE HEALTH INSURANCE | Admitting: Obstetrics & Gynecology

## 2023-09-06 NOTE — Progress Notes (Signed)
 This encounter was created in error - please disregard.  [facility]: repeat HPV based cytology 1 year   LSIL +HPV(negative 16, 18/45) Negative HPV 01/2019  Scheduled to see Dr Frutoso Jing 12/13/23, likely with have hsyterectomy with anterior colporrhpahy so will not be an issue in the future either  Wendelyn Halter, MD 09/06/2023 12:02 PM   :

## 2023-10-25 ENCOUNTER — Other Ambulatory Visit: Payer: Self-pay | Admitting: Student

## 2023-12-13 ENCOUNTER — Encounter: Payer: Self-pay | Admitting: Obstetrics and Gynecology

## 2023-12-13 ENCOUNTER — Other Ambulatory Visit (HOSPITAL_COMMUNITY)
Admission: RE | Admit: 2023-12-13 | Discharge: 2023-12-13 | Disposition: A | Discharge status: Home / Self Care | Payer: PRIVATE HEALTH INSURANCE | Source: Other Acute Inpatient Hospital | Attending: Obstetrics and Gynecology | Admitting: Obstetrics and Gynecology

## 2023-12-13 ENCOUNTER — Ambulatory Visit: Payer: PRIVATE HEALTH INSURANCE | Admitting: Obstetrics and Gynecology

## 2023-12-13 VITALS — BP 133/85 | HR 81 | Ht 64.57 in | Wt 186.4 lb

## 2023-12-13 DIAGNOSIS — N812 Incomplete uterovaginal prolapse: Secondary | ICD-10-CM | POA: Diagnosis not present

## 2023-12-13 DIAGNOSIS — R350 Frequency of micturition: Secondary | ICD-10-CM | POA: Insufficient documentation

## 2023-12-13 LAB — POCT URINALYSIS DIP (CLINITEK)
Bilirubin, UA: NEGATIVE
Blood, UA: NEGATIVE
Glucose, UA: NEGATIVE mg/dL
Ketones, POC UA: NEGATIVE mg/dL
Nitrite, UA: NEGATIVE
POC PROTEIN,UA: NEGATIVE
Spec Grav, UA: 1.02 (ref 1.010–1.025)
Urobilinogen, UA: 0.2 U/dL
pH, UA: 7 (ref 5.0–8.0)

## 2023-12-13 NOTE — Assessment & Plan Note (Signed)
 Stage I anterior, Stage I posterior, Stage III apical prolapse - For treatment of pelvic organ prolapse, we discussed options for management including expectant management, conservative management, and surgical management, such as Kegels, a pessary, pelvic floor physical therapy, and specific surgical procedures. - We discussed two options for prolapse repair:  1) vaginal repair without mesh - Pros - safer, no mesh complications - Cons - not as strong as mesh repair, higher risk of recurrence  2) laparoscopic repair with mesh - Pros - stronger, better long-term success - Cons - risks of mesh implant (erosion into vagina or bladder, adhering to the rectum, pain) - these risks are lower than with a vaginal mesh but still exist - She is not interested right now and prefers a pessary trial. Will have her return for a fitting.  - Will also message Dr Ozan regarding abnormal pap smear as colposcopy was recommended.

## 2023-12-13 NOTE — Progress Notes (Signed)
 New Patient Evaluation and Consultation  Referring Provider: Ozan, Jennifer, DO PCP: Howell Lunger, DO Date of Service: 12/13/2023  SUBJECTIVE Chief Complaint: New Patient (Initial Visit) (Veronica Coleman is a 58 y.o. female here today for female organ prolapse.)  History of Present Illness: Veronica Coleman is a 58 y.o. Black or African-American female seen in consultation at the request of Dr Ozan for evaluation of prolapse.     Urinary Symptoms: Does not leak urine.   Day time voids 8.  Nocturia: 1 times per night to void. Voiding dysfunction:  does not empty bladder well.  Patient does not use a catheter to empty bladder.  When urinating, patient feels difficulty starting urine stream Drinks: 2-16oz bottles water, 1 cup coffee, occasional tea or soda  UTIs: 0 UTI's in the last year.   Denies history of blood in urine and kidney or bladder stones   Pelvic Organ Prolapse Symptoms:                  Patient Admits to a feeling of a bulge the vaginal area. It has been present for 3 months.  Patient Admits to seeing a bulge.  This bulge is bothersome. Notices it more when she stands at work  Bowel Symptom: Bowel movements: 2 time(s) per day Stool consistency: hard or soft  Straining: no.  Splinting: yes.  Incomplete evacuation: yes.  Patient Denies accidental bowel leakage / fecal incontinence Bowel regimen: none  HM Colonoscopy          Upcoming     Colonoscopy (Every 10 Years) Next due on 11/29/2025    11/30/2015  COLONOSCOPY   Only the first 1 history entries have been loaded, but more history exists.                Sexual Function Sexually active: yes.  Pain with sex: No  Pelvic Pain Denies pelvic pain   Past Medical History:  Past Medical History:  Diagnosis Date   Cystocele with prolapse    Environmental allergies    Hypertension      Past Surgical History:   Past Surgical History:  Procedure Laterality Date   COLONOSCOPY       Past  OB/GYN History: OB History  Gravida Para Term Preterm AB Living  3 3 3   3   SAB IAB Ectopic Multiple Live Births      3    # Outcome Date GA Lbr Len/2nd Weight Sex Type Anes PTL Lv  3 Term     F Vag-Spont   LIV  2 Term     M Vag-Spont   LIV  1 Term     M Vag-Spont   LIV   No significant tears into the rectum with deliveries Menopausal: Denies vaginal bleeding since menopause     Component Value Date/Time   DIAGPAP - Low grade squamous intraepithelial lesion (LSIL) (A) 08/23/2023 1134   DIAGPAP  01/21/2019 1558    - Negative for intraepithelial lesion or malignancy (NILM)   HPVHIGH Positive (A) 08/23/2023 1134   HPVHIGH Negative 01/21/2019 1558   ADEQPAP  08/23/2023 1134    Satisfactory for evaluation; transformation zone component PRESENT.   ADEQPAP  01/21/2019 1558    Satisfactory for evaluation; transformation zone component PRESENT.    Medications: Patient has a current medication list which includes the following prescription(s): amlodipine .   Allergies: Patient has no known allergies.   Social History:  Social History   Tobacco Use   Smoking status:  Never   Smokeless tobacco: Never  Vaping Use   Vaping status: Never Used  Substance Use Topics   Alcohol use: No   Drug use: No    Relationship status: married Patient lives with husband.   Patient is employed at [employer]. Regular exercise: No History of abuse: No  Family History:   Family History  Problem Relation Age of Onset   Hypertension Mother    Asthma Son    Colon cancer Neg Hx    Breast cancer Neg Hx    Bladder Cancer Neg Hx    Renal cancer Neg Hx    Uterine cancer Neg Hx      Review of Systems: Review of Systems  Constitutional:  Negative for fever, malaise/fatigue and weight loss.  Respiratory:  Negative for cough, shortness of breath and wheezing.   Cardiovascular:  Negative for chest pain, palpitations and leg swelling.  Gastrointestinal:  Negative for abdominal pain and blood in  stool.  Genitourinary:  Negative for dysuria.  Musculoskeletal:  Negative for myalgias.  Skin:  Negative for rash.  Neurological:  Negative for dizziness and headaches.  Endo/Heme/Allergies:  Does not bruise/bleed easily.  Psychiatric/Behavioral:  Negative for depression. The patient is not nervous/anxious.      OBJECTIVE Physical Exam: Vitals:   12/13/23 1018  BP: 133/85  Pulse: 81  Weight: 186 lb 6.4 oz (84.6 kg)  Height: 5' 4.57 (1.64 m)    Physical Exam Vitals reviewed. Exam conducted with a chaperone present.  Constitutional:      General: She is not in acute distress. Pulmonary:     Effort: Pulmonary effort is normal.  Abdominal:     General: There is no distension.     Palpations: Abdomen is soft.     Tenderness: There is no abdominal tenderness. There is no rebound.  Musculoskeletal:        General: No swelling. Normal range of motion.  Skin:    General: Skin is warm and dry.     Findings: No rash.  Neurological:     Mental Status: She is alert and oriented to person, place, and time.  Psychiatric:        Mood and Affect: Mood normal.        Behavior: Behavior normal.      GU / Detailed Urogynecologic Evaluation:  Pelvic Exam: Normal external female genitalia; Bartholin's and Skene's glands normal in appearance; urethral meatus normal in appearance, no urethral masses or discharge.   CST: negative  Speculum exam reveals normal vaginal mucosa with atrophy. Cervix normal appearance. Uterus normal single, nontender. Adnexa no mass, fullness, tenderness.     Pelvic floor strength II/V, puborectalis III/V external anal sphincter III/V  Pelvic floor musculature: Right levator non-tender, Right obturator non-tender, Left levator non-tender, Left obturator non-tender  POP-Q:   POP-Q  -2.5                                            Aa   -2.5                                           Ba  3  C   4.5                                             Gh  4.5                                            Pb  8                                            tvl   -3                                            Ap  -3                                            Bp  -2.5                                              D      Rectal Exam:  Normal sphincter tone, small distal rectocele, enterocoele not present, no rectal masses, no sign of dyssynergia when asking the patient to bear down.  Post-Void Residual (PVR) by Bladder Scan: In order to evaluate bladder emptying, we discussed obtaining a postvoid residual and patient agreed to this procedure.  Procedure: The ultrasound unit was placed on the patient's abdomen in the suprapubic region after the patient had voided.    Post Void Residual - 12/13/23 1026       Post Void Residual   Post Void Residual 7 mL           Laboratory Results: Lab Results  Component Value Date   COLORU yellow 12/13/2023   CLARITYU cloudy (A) 12/13/2023   GLUCOSEUR negative 12/13/2023   BILIRUBINUR negative 12/13/2023   KETONESU TRACE 06/13/2014   SPECGRAV 1.020 12/13/2023   RBCUR negative 12/13/2023   PHUR 7.0 12/13/2023   PROTEINUR negative 04/07/2023   UROBILINOGEN 0.2 12/13/2023   LEUKOCYTESUR Large (3+) (A) 12/13/2023    Lab Results  Component Value Date   CREATININE 0.65 12/06/2022   CREATININE 0.63 06/16/2021   CREATININE 0.57 02/03/2020    Lab Results  Component Value Date   HGBA1C 6.0 12/06/2022    Lab Results  Component Value Date   HGB 15.6 (H) 02/27/2014     ASSESSMENT AND PLAN Ms. Yarrow is a 58 y.o. with:  1. Uterovaginal prolapse, incomplete   2. Urinary frequency     Uterovaginal prolapse, incomplete Assessment & Plan: Stage I anterior, Stage I posterior, Stage III apical prolapse - For treatment of pelvic organ prolapse, we discussed options for management including expectant management, conservative management, and surgical  management, such as Kegels, a pessary, pelvic floor physical therapy, and specific surgical procedures. - We discussed two options for prolapse repair:  1)  vaginal repair without mesh - Pros - safer, no mesh complications - Cons - not as strong as mesh repair, higher risk of recurrence  2) laparoscopic repair with mesh - Pros - stronger, better long-term success - Cons - risks of mesh implant (erosion into vagina or bladder, adhering to the rectum, pain) - these risks are lower than with a vaginal mesh but still exist - She is not interested right now and prefers a pessary trial. Will have her return for a fitting.  - Will also message Dr Ozan regarding abnormal pap smear as colposcopy was recommended.    Urinary frequency Assessment & Plan: - Not bothersome to patient, no incontinence. But does have some sensation of incomplete emptying despite normal PVR - Can reassess symptoms after pessary placement.   Orders: -     POCT URINALYSIS DIP (CLINITEK) -     Urine Culture; Future  Return for pessary fitting.    Rosaline LOISE Caper, MD

## 2023-12-13 NOTE — Assessment & Plan Note (Signed)
-   Not bothersome to patient, no incontinence. But does have some sensation of incomplete emptying despite normal PVR - Can reassess symptoms after pessary placement.

## 2023-12-13 NOTE — Patient Instructions (Signed)

## 2023-12-14 LAB — URINE CULTURE: Culture: 30000 — AB

## 2023-12-15 ENCOUNTER — Ambulatory Visit: Payer: Self-pay | Admitting: Obstetrics and Gynecology

## 2024-01-27 IMAGING — MG MM DIGITAL SCREENING BILAT W/ TOMO AND CAD
8 of 14 series · 8 of 40 positions shown · non-contrast
Comparison: Previous exam(s).

CLINICAL DATA: Screening.

EXAM:
DIGITAL SCREENING BILATERAL MAMMOGRAM WITH TOMOSYNTHESIS AND CAD
TECHNIQUE: Bilateral screening digital craniocaudal and mediolateral oblique
mammograms were obtained. Bilateral screening digital breast
tomosynthesis was performed. The images were evaluated with
computer-aided detection.

[R CC synth-2D]
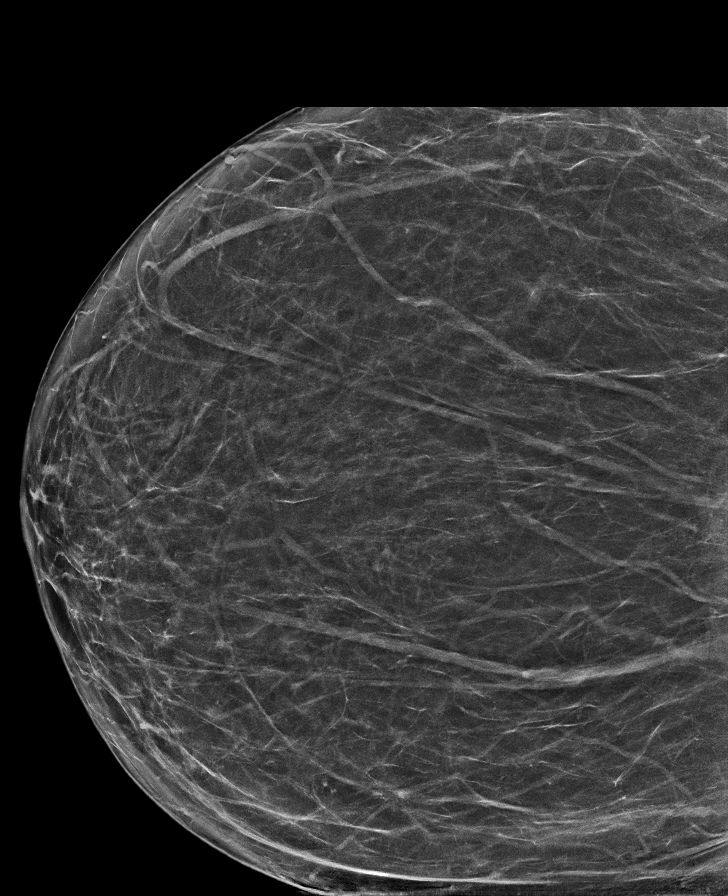

[R MLO synth-2D (1 of 2)]
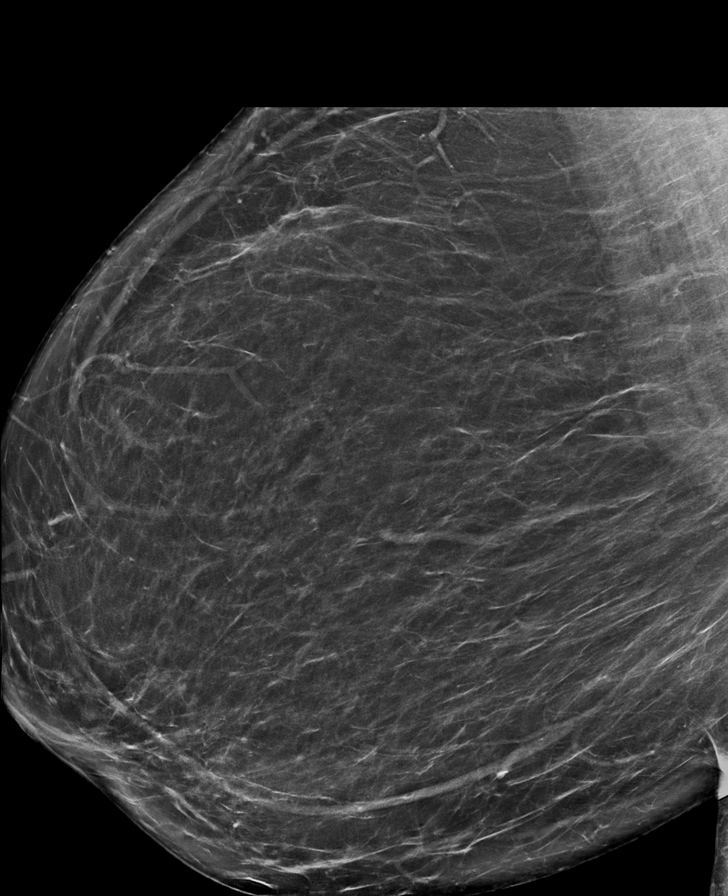

[L CC synth-2D]
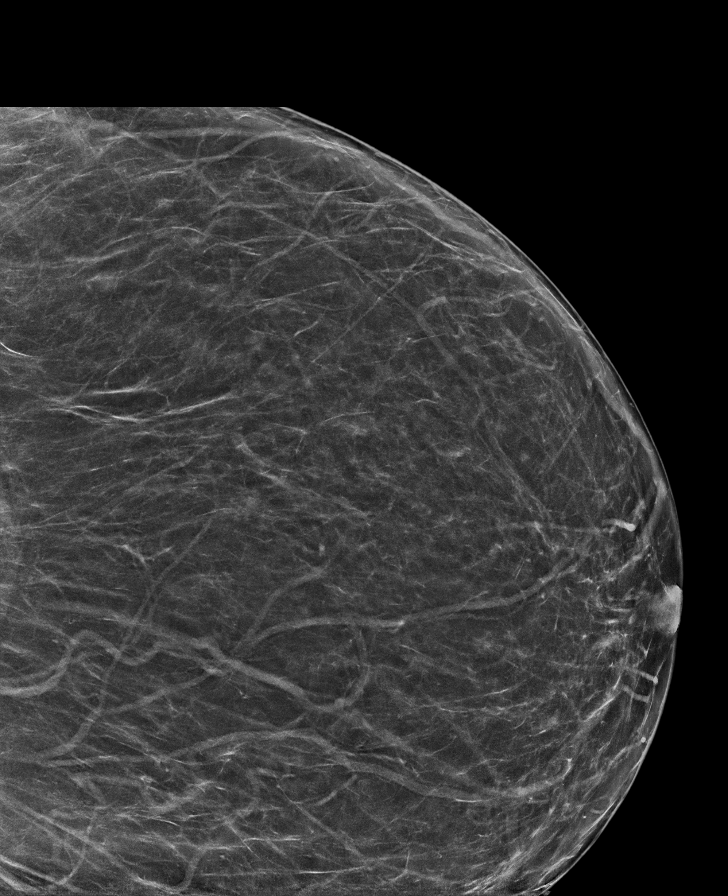

[L MLO synth-2D (1 of 2)]
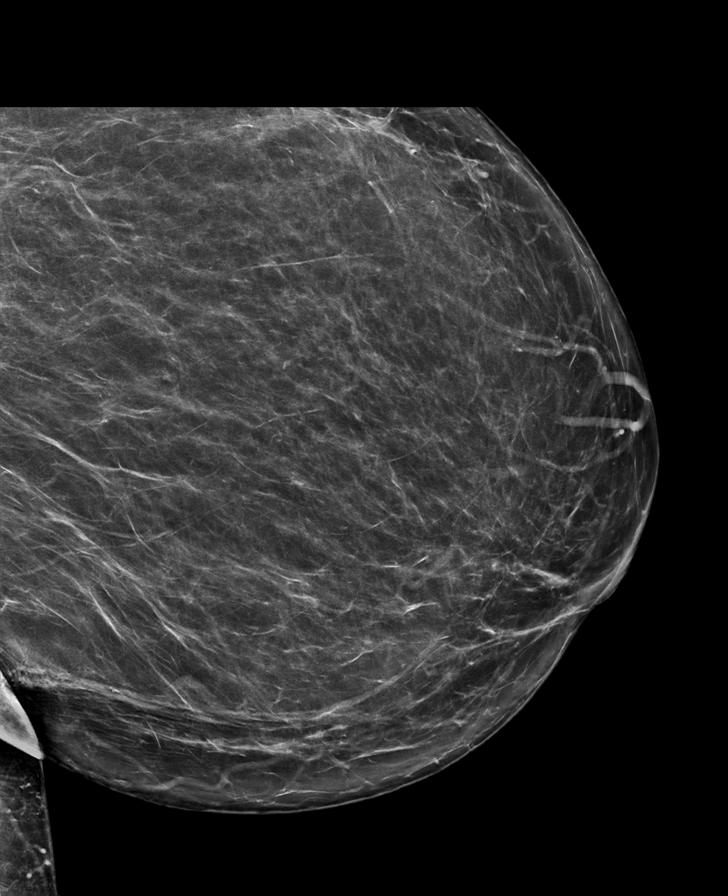

[L CV synth-2D]
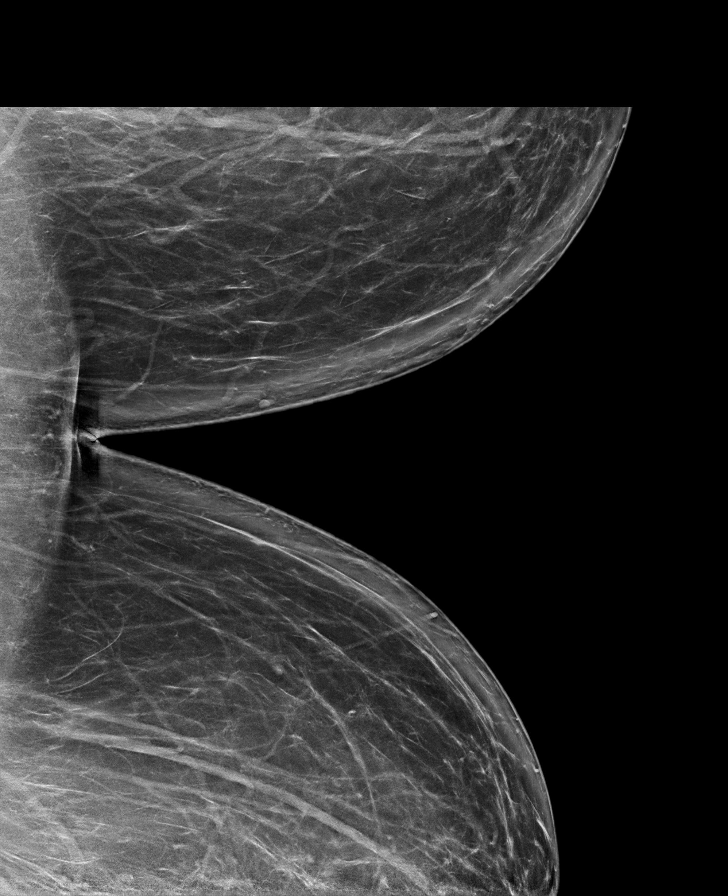

[L MLO synth-2D (2 of 2)]
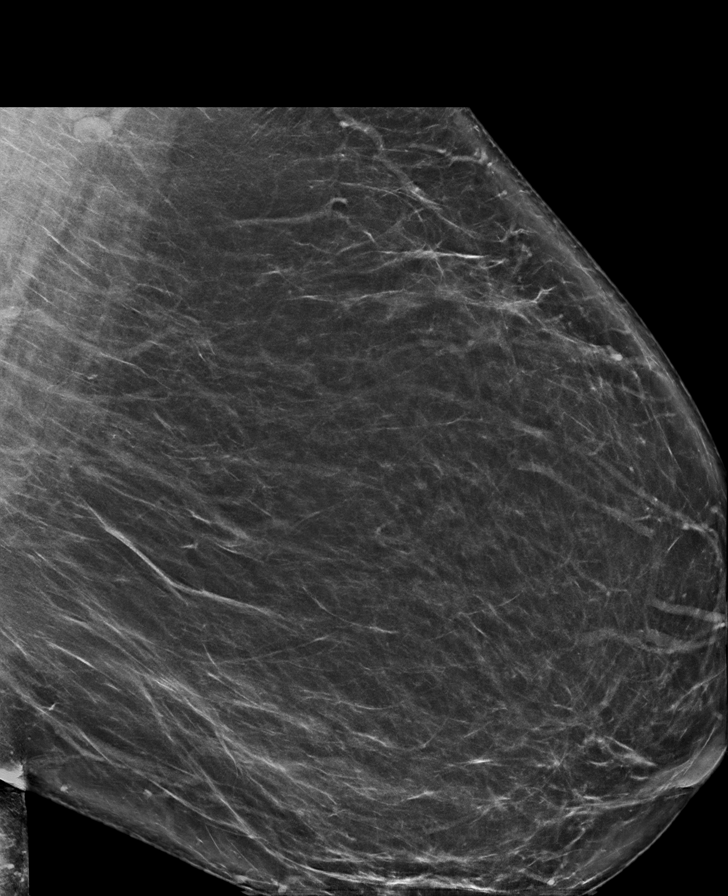

[R MLO synth-2D (2 of 2)]
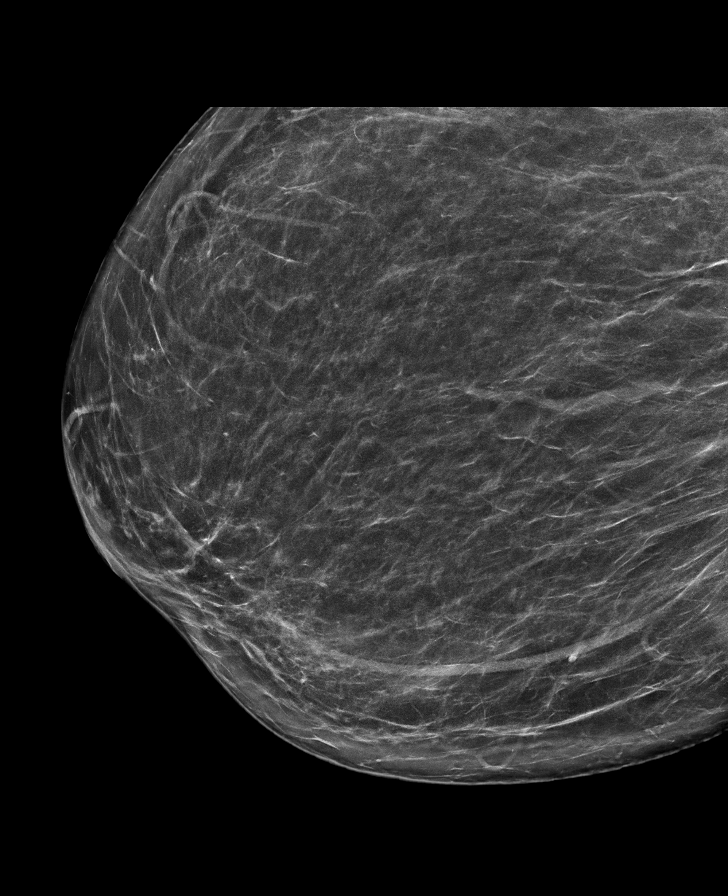

[R CC tomo · tomo slice 43/86.0]
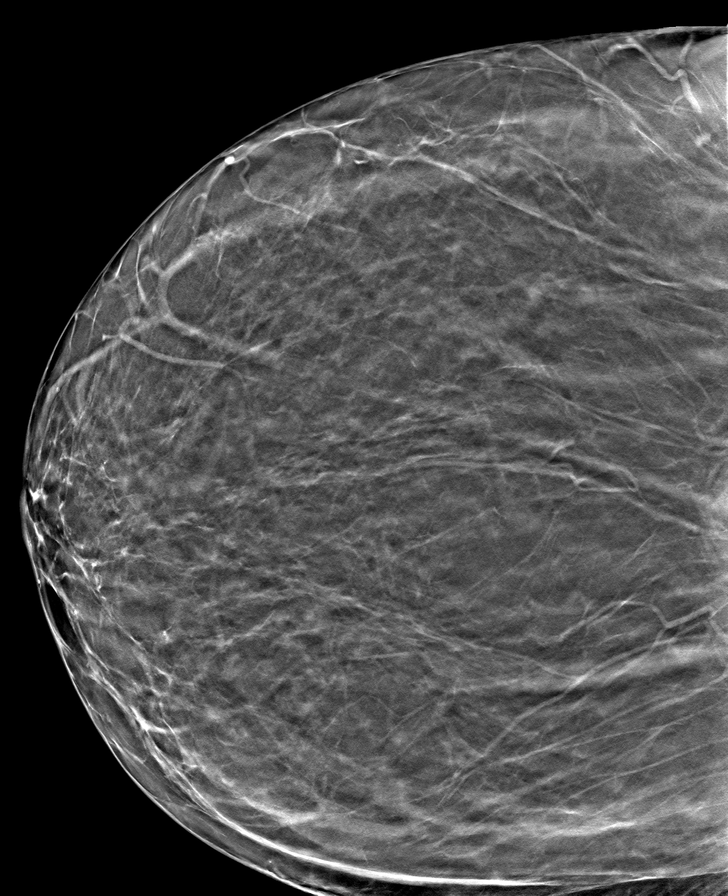

[8 of 40 positions shown; findings below may reference images not displayed]

ACR Breast Density Category b: There are scattered areas of
fibroglandular density.
FINDINGS: There are no findings suspicious for malignancy.
IMPRESSION: No mammographic evidence of malignancy. A result letter of this
screening mammogram will be mailed directly to the patient.

RECOMMENDATION:
Screening mammogram in one year. (Code:51-O-LD2)

BI-RADS CATEGORY  1: Negative.

## 2024-02-07 ENCOUNTER — Ambulatory Visit: Payer: Self-pay | Admitting: Student

## 2024-02-07 ENCOUNTER — Ambulatory Visit: Payer: PRIVATE HEALTH INSURANCE | Admitting: Student

## 2024-02-07 ENCOUNTER — Encounter: Payer: Self-pay | Admitting: Student

## 2024-02-07 VITALS — BP 120/80 | HR 78 | Ht 64.0 in | Wt 185.6 lb

## 2024-02-07 DIAGNOSIS — Z23 Encounter for immunization: Secondary | ICD-10-CM

## 2024-02-07 DIAGNOSIS — Z1159 Encounter for screening for other viral diseases: Secondary | ICD-10-CM

## 2024-02-07 DIAGNOSIS — R7303 Prediabetes: Secondary | ICD-10-CM | POA: Diagnosis not present

## 2024-02-07 DIAGNOSIS — I10 Essential (primary) hypertension: Secondary | ICD-10-CM

## 2024-02-07 DIAGNOSIS — Z0000 Encounter for general adult medical examination without abnormal findings: Secondary | ICD-10-CM | POA: Diagnosis not present

## 2024-02-07 LAB — POCT GLYCOSYLATED HEMOGLOBIN (HGB A1C): HbA1c, POC (prediabetic range): 6 % (ref 5.7–6.4)

## 2024-02-07 MED ORDER — AMLODIPINE BESYLATE 10 MG PO TABS: 10.0000 mg | ORAL_TABLET | Freq: Every day | ORAL | 0 refills | Status: AC
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
## 2024-02-07 NOTE — Assessment & Plan Note (Signed)
 Well controlled. -BMP -Continue amlodipine , refill sent

## 2024-02-07 NOTE — Patient Instructions (Addendum)
 It was great to see you! Thank you for allowing me to participate in your care!   I recommend that you always bring your medications to each appointment as this makes it easy to ensure we are on the correct medications and helps us  not miss when refills are needed.  Our plans for today:  - We are checking some labs today, I will call you if they are abnormal will send you a MyChart message or a letter if they are normal.  If you do not hear about your labs in the next 2 weeks please let us  know.  Take care and seek immediate care sooner if you develop any concerns. Please remember to show up 15 minutes before your scheduled appointment time!  Gladis Church, DO [facility]   Call the number below to schedule appointment for COLPOSCOPY [facility] for [facility] at [facility] [phone]

## 2024-02-07 NOTE — Progress Notes (Signed)
    SUBJECTIVE:   Chief compliant/HPI: annual examination  Veronica Coleman is a 58 y.o. who presents today for an annual exam.   History tabs reviewed and updated.   OBJECTIVE:   BP 120/80   Pulse 78   Ht 5' 4 (1.626 m)   Wt 185 lb 9.6 oz (84.2 kg)   SpO2 100%   BMI 31.86 kg/m    General: NAD, pleasant Cardio: RRR, no MRG. Respiratory: CTAB, normal wob on RA GI: Abdomen is soft, not tender, not distended. BS present Skin: Warm and dry  ASSESSMENT/PLAN:   Assessment & Plan Annual physical exam PHQ score 0, reviewed and discussed.  BP reviewed and at goal. Asked about intimate partner violence and resources given as appropriate   Considered the following items based upon USPSTF recommendations: Diabetes screening: ordered HIV testing:previously done Hepatitis C: previously done Hepatitis B:ordered Syphilis if at high risk: Not high risk GC/CT not at high risk and not ordered. Lipid panel (nonfasting or fasting) discussed based upon AHA recommendations and ordered.  Consider repeat every 4-6 years.  Osteoporosis screening: Not discussed  Cancer Screening Discussion  Cervical cancer screening: Completed by OB/GYN in April, results did show L SIL, HPV positive-recommendation for colposcopy, follows with OB/GYN Breast cancer screening: recently completed and repeat not yet indicated Discussed family history, BRCA testing not indicated. Tool used to risk stratify was [place].  Lung cancer screening:not indicated as does not meet criteria.  See documentation below regarding indications/risks/benefits.  Colorectal cancer screening: up to date on screening for CRC..  Vaccinations: Flu shot today.   Follow up in 1 year or sooner if indicated.  MyChart Activation: Already signed up Essential hypertension Well controlled. -BMP -Continue amlodipine , refill sent   Gladis Church, DO [facility]

## 2024-02-08 LAB — BASIC METABOLIC PANEL WITH GFR
BUN/Creatinine Ratio: 15 (ref 9–23)
BUN: 9 mg/dL (ref 6–24)
CO2: 21 mmol/L (ref 20–29)
Calcium: 9.5 mg/dL (ref 8.7–10.2)
Chloride: 106 mmol/L (ref 96–106)
Creatinine, Ser: 0.61 mg/dL (ref 0.57–1.00)
Glucose: 74 mg/dL (ref 70–99)
Potassium: 4.5 mmol/L (ref 3.5–5.2)
Sodium: 141 mmol/L (ref 134–144)
eGFR: 104 mL/min/1.73 (ref >59)

## 2024-02-08 LAB — LIPID PANEL
Chol/HDL Ratio: 2.6 ratio (ref 0.0–4.4)
Cholesterol, Total: 150 mg/dL (ref 100–199)
HDL: 58 mg/dL (ref >39)
LDL Chol Calc (NIH): 81 mg/dL (ref 0–99)
Triglycerides: 52 mg/dL (ref 0–149)
VLDL Cholesterol Cal: 11 mg/dL (ref 5–40)

## 2024-02-08 LAB — HEPATITIS B SURFACE ANTIBODY, QUANTITATIVE: Hepatitis B Surf Ab Quant: 10.1 m[IU]/mL

## 2024-02-08 LAB — HEPATITIS B CORE ANTIBODY, TOTAL: Hep B Core Total Ab: POSITIVE — AB

## 2024-02-08 LAB — HEPATITIS B SURFACE ANTIGEN: Hepatitis B Surface Ag: NEGATIVE

## 2024-02-14 ENCOUNTER — Encounter: Payer: Self-pay | Admitting: Obstetrics and Gynecology

## 2024-02-14 ENCOUNTER — Ambulatory Visit: Payer: PRIVATE HEALTH INSURANCE | Admitting: Obstetrics and Gynecology

## 2024-02-14 VITALS — BP 137/84 | HR 83

## 2024-02-14 DIAGNOSIS — N813 Complete uterovaginal prolapse: Secondary | ICD-10-CM | POA: Diagnosis not present

## 2024-02-14 DIAGNOSIS — N952 Postmenopausal atrophic vaginitis: Secondary | ICD-10-CM

## 2024-02-14 DIAGNOSIS — N812 Incomplete uterovaginal prolapse: Secondary | ICD-10-CM

## 2024-02-14 MED ORDER — ESTRADIOL 0.01 % VA CREA: 0.5000 g | TOPICAL_CREAM | VAGINAL | 11 refills | Status: AC

## 2024-02-14 NOTE — Progress Notes (Signed)
 [facility]   Subjective:     Chief Complaint: Pessary Check (Kaydan CHRISTELLA Bain is a 58 y.o. female is here for pessary check/)  History of Present Illness: Cariann RICHETTA CUBILLOS is a 58 y.o. female with stage III pelvic organ prolapse who presents today for a pessary fitting.    Past Medical History: Patient  has a past medical history of Cystocele with prolapse, Environmental allergies, and Hypertension.   Past Surgical History: She  has a past surgical history that includes Colonoscopy.   Medications: She has a current medication list which includes the following prescription(s): amlodipine  and [START ON 02/15/2024] estradiol.   Allergies: Patient has no known allergies.   Social History: Patient  reports that she has never smoked. She has never used smokeless tobacco. She reports that she does not drink alcohol and does not use drugs.      Objective:    BP 137/84   Pulse 83  Gen: No apparent distress, A&O x 3. Pelvic Exam: Normal external female genitalia; Bartholin's and Skene's glands normal in appearance; urethral meatus normal in appearance, no urethral masses or discharge.   A size #4 ring with support pessary (Lot [id number]) was fitted. It was comfortable, stayed in place with valsalva and was an appropriate size on examination, with one finger fitting between the pessary and the vaginal walls. Attempted to encourage patient to remove pessary on her own and she felt uncomfortable sticking her fingers that far into her vagina. Attempted to have her try lying down and standing. Patient elected to have us  manage it for her.    Assessment/Plan:    Assessment: Ms. Vigna is a 58 y.o. with stage III pelvic organ prolapse who presents for a pessary fitting. Plan: She was fitted with a #4 ring with support pessary. She will keep the pessary in place until next visit. She will use estrogen.   Follow-up in 4 weeks for a pessary check or sooner as needed.  All questions  were answered.    Keedan Sample G Glenice Ciccone, NP

## 2024-02-14 NOTE — Patient Instructions (Signed)
 For the estrogen cream use this nightly for the next two weeks and after that use it twice a week. Use a blueberry sized amount of the cream onto your finger and place in the vagina.   If the pessary pops out, do not panic, just wash with soap and water and give the office a call. It is not an emergency. We will work you in to place a different one if needed.

## 2024-02-15 ENCOUNTER — Telehealth: Payer: Self-pay

## 2024-02-15 NOTE — Telephone Encounter (Signed)
 Patient calls stating her pessary fell out. She went to the bathroom this morning and fell in the toilet. She does have the pessary and would like to come back in for replacement or fitting. Please advise.

## 2024-02-15 NOTE — Telephone Encounter (Signed)
 Work in appointment made for 02-16-2024

## 2024-02-16 ENCOUNTER — Encounter: Payer: Self-pay | Admitting: Obstetrics and Gynecology

## 2024-02-16 ENCOUNTER — Ambulatory Visit: Payer: PRIVATE HEALTH INSURANCE | Admitting: Obstetrics and Gynecology

## 2024-02-16 VITALS — BP 149/99 | HR 80

## 2024-02-16 DIAGNOSIS — N813 Complete uterovaginal prolapse: Secondary | ICD-10-CM

## 2024-02-16 DIAGNOSIS — N812 Incomplete uterovaginal prolapse: Secondary | ICD-10-CM

## 2024-02-16 DIAGNOSIS — Z466 Encounter for fitting and adjustment of urinary device: Secondary | ICD-10-CM

## 2024-02-16 NOTE — Progress Notes (Signed)
 [facility]   Subjective:     Chief Complaint: Follow-up (Veronica Coleman is a 58 y.o. female ere today for pessary fitting)  History of Present Illness: Veronica Coleman is a 58 y.o. female with stage III pelvic organ prolapse who presents today for a pessary fitting. Failed trial of #4 RWS.   Past Medical History: Patient  has a past medical history of Cystocele with prolapse, Environmental allergies, and Hypertension.   Past Surgical History: She  has a past surgical history that includes Colonoscopy.   Medications: She has a current medication list which includes the following prescription(s): amlodipine  and estradiol.   Allergies: Patient has no known allergies.   Social History: Patient  reports that she has never smoked. She has never used smokeless tobacco. She reports that she does not drink alcohol and does not use drugs.      Objective:    BP (!) 156/83   Pulse 79  Gen: No apparent distress, A&O x 3. Pelvic Exam: Normal external female genitalia; Bartholin's and Skene's glands normal in appearance; urethral meatus normal in appearance, no urethral masses or discharge.   A size #4 short stem gellhorn pessary ([id number]) was fitted. It was comfortable, stayed in place with valsalva and was an appropriate size on examination, with one finger fitting between the pessary and the vaginal walls.     Assessment/Plan:    Assessment: Veronica Coleman is a 58 y.o. with stage III pelvic organ prolapse who presents for a pessary fitting. Plan: She was fitted with a #4 short stem gellhorn pessary. She will keep the pessary in place until next visit. She will use estrogen.   Follow-up in 4 weeks for a pessary check or sooner as needed.  All questions were answered.    Mamoudou Mulvehill G Kent Braunschweig, NP

## 2024-03-20 ENCOUNTER — Ambulatory Visit (INDEPENDENT_AMBULATORY_CARE_PROVIDER_SITE_OTHER): Payer: PRIVATE HEALTH INSURANCE | Admitting: Obstetrics and Gynecology

## 2024-03-20 ENCOUNTER — Other Ambulatory Visit (HOSPITAL_COMMUNITY)
Admission: RE | Admit: 2024-03-20 | Discharge: 2024-03-20 | Disposition: A | Discharge status: Home / Self Care | Payer: PRIVATE HEALTH INSURANCE | Source: Ambulatory Visit | Attending: Obstetrics and Gynecology | Admitting: Obstetrics and Gynecology

## 2024-03-20 ENCOUNTER — Encounter: Payer: Self-pay | Admitting: Obstetrics and Gynecology

## 2024-03-20 VITALS — BP 121/79 | HR 98

## 2024-03-20 DIAGNOSIS — N898 Other specified noninflammatory disorders of vagina: Secondary | ICD-10-CM | POA: Diagnosis present

## 2024-03-20 DIAGNOSIS — N813 Complete uterovaginal prolapse: Secondary | ICD-10-CM

## 2024-03-20 DIAGNOSIS — Z960 Presence of urogenital implants: Secondary | ICD-10-CM | POA: Diagnosis not present

## 2024-03-20 MED ORDER — FLUCONAZOLE 150 MG PO TABS: 150.0000 mg | ORAL_TABLET | Freq: Once | ORAL | 0 refills | Status: AC

## 2024-03-20 NOTE — Progress Notes (Signed)
 [facility]   Subjective:     Chief Complaint:  Chief Complaint  Patient presents with   Pessary Check   History of Present Illness: Veronica Coleman is a 58 y.o. female with stage III pelvic organ prolapse who presents for a pessary check. She is using a size #4 short stem gellhorn pessary. The pessary has been working well and she has no complaints. She is using vaginal estrogen. She denies vaginal bleeding.  Past Medical History: Patient  has a past medical history of Cystocele with prolapse, Environmental allergies, and Hypertension.   Past Surgical History: She  has a past surgical history that includes Colonoscopy.   Medications: She has a current medication list which includes the following prescription(s): amlodipine , estradiol, and fluconazole.   Allergies: Patient has no known allergies.   Social History: Patient  reports that she has never smoked. She has never used smokeless tobacco. She reports that she does not drink alcohol and does not use drugs.      Objective:    Physical Exam: BP 121/79   Pulse 98  Gen: No apparent distress, A&O x 3. Detailed Urogynecologic Evaluation:  Pelvic Exam: Normal external female genitalia; Bartholin's and Skene's glands normal in appearance; urethral meatus normal in appearance, no urethral masses. Patient does have some vaginal discharge with associated itching, aptima obtained. The pessary was noted to be in place. It was removed and cleaned. Speculum exam revealed no lesions in the vagina. The pessary was replaced. It was comfortable to the patient and fit well.    Assessment/Plan:    Assessment: Veronica Coleman is a 58 y.o. with stage III pelvic organ prolapse here for a pessary check. She is doing well.  Plan: She will keep the pessary in place until next visit. She will continue to use estrogen. She will also use boric acid suppositories x2 weeks. She will follow-up in 3 months for a pessary check or sooner as  needed, she is interested in discussing surgical options again with Dr. Marilynne. Long-term she thinks she may be happier with a repair than pessary.

## 2024-03-20 NOTE — Patient Instructions (Signed)
 Start boric acid suppositories nightly for the next 2 weeks and take the dose of diflucan .

## 2024-03-21 ENCOUNTER — Ambulatory Visit: Payer: Self-pay | Admitting: Obstetrics and Gynecology

## 2024-03-21 DIAGNOSIS — B9689 Other specified bacterial agents as the cause of diseases classified elsewhere: Secondary | ICD-10-CM

## 2024-03-21 LAB — CERVICOVAGINAL ANCILLARY ONLY
Bacterial Vaginitis (gardnerella): POSITIVE — AB
Candida Glabrata: NEGATIVE
Candida Vaginitis: POSITIVE — AB
Comment: NEGATIVE
Comment: NEGATIVE
Comment: NEGATIVE

## 2024-03-21 MED ORDER — METRONIDAZOLE 0.75 % VA GEL: 1.0000 | Freq: Every day | VAGINAL | 0 refills | Status: AC

## 2024-04-29 ENCOUNTER — Encounter (HOSPITAL_COMMUNITY): Payer: Self-pay

## 2024-04-29 ENCOUNTER — Ambulatory Visit (HOSPITAL_COMMUNITY)
Admission: EM | Admit: 2024-04-29 | Discharge: 2024-04-29 | Disposition: A | Discharge status: Home / Self Care | Payer: PRIVATE HEALTH INSURANCE

## 2024-04-29 DIAGNOSIS — J111 Influenza due to unidentified influenza virus with other respiratory manifestations: Secondary | ICD-10-CM

## 2024-04-29 NOTE — ED Provider Notes (Signed)
 " [facility]    CSN: [id number] Arrival date & time: 04/29/24  1044      History   Chief Complaint Chief Complaint  Patient presents with   Fever   Headache   Generalized Body Aches    HPI Veronica Coleman is a 58 y.o. female presenting on day 4 of flulike illness.  Pt c/o body aches and (L) neck pain since Christmas. Pt reports  tactile fever yesterday and today, no temp taken but pt reports chills. Also with myalgias.. Patient took Tylenol around 7 am (6 hours ago), which was somewhat effective for the myalgias and chills.  Pt denies cough, sore throat, nausea, vomiting, or diarrhea. Pt denies any known exposures, she did attend work Christmas party on 12/24.  Needs a work note.  The patient denies a history of pulmonary disease. Never smoker.   HPI  Past Medical History:  Diagnosis Date   Cystocele with prolapse    Environmental allergies    Hypertension     Patient Active Problem List   Diagnosis Date Noted   Urinary frequency 12/13/2023   Uterovaginal prolapse, incomplete 12/13/2023   Burning with urination 11/23/2022   Mechanical foot pain, left 01/02/2018   Prediabetes 12/07/2016   Annual physical exam 11/04/2015   Cystocele 12/24/2012   Obesity (BMI 30-39.9) 12/20/2011   Plantar fasciitis 11/01/2011   GERD (gastroesophageal reflux disease) 01/21/2010   Menopause 12/16/2008   Back pain 05/07/2007   Essential hypertension 09/18/2006   Allergic rhinitis 06/29/2006    Past Surgical History:  Procedure Laterality Date   COLONOSCOPY      OB History     Gravida  3   Para  3   Term  3   Preterm      AB      Living  3      SAB      IAB      Ectopic      Multiple      Live Births  3            Home Medications    Prior to Admission medications  Medication Sig Start Date End Date Taking? Authorizing Provider  amLODipine  (NORVASC ) 10 MG tablet Take 1 tablet (10 mg total) by mouth daily. 02/07/24  Yes Howell Lunger, DO   estradiol  (ESTRACE ) 0.01 % CREA vaginal cream Place 0.5 g vaginally 2 (two) times a week. Place 0.5g nightly for two weeks then twice a week after 02/15/24  Yes Zuleta, Kaitlin G, NP  metroNIDAZOLE  (METROGEL ) 0.75 % vaginal gel Place 1 Applicatorful vaginally at bedtime. Use for 5 nights. 03/21/24  Yes Zuleta, Kaitlin G, NP    Family History Family History  Problem Relation Age of Onset   Hypertension Mother    Asthma Son    Colon cancer Neg Hx    Breast cancer Neg Hx    Bladder Cancer Neg Hx    Renal cancer Neg Hx    Uterine cancer Neg Hx     Social History Social History[1]   Allergies   Patient has no known allergies.   Review of Systems Review of Systems  Constitutional:  Positive for chills and fever. Negative for appetite change.  HENT:  Negative for congestion, ear pain, rhinorrhea, sinus pressure, sinus pain and sore throat.   Eyes:  Negative for redness and visual disturbance.  Respiratory:  Negative for cough, chest tightness, shortness of breath and wheezing.   Cardiovascular:  Negative for chest pain  and palpitations.  Gastrointestinal:  Negative for abdominal pain, constipation, diarrhea, nausea and vomiting.  Genitourinary:  Negative for dysuria, frequency and urgency.  Musculoskeletal:  Positive for myalgias.  Neurological:  Negative for dizziness, weakness and headaches.  Psychiatric/Behavioral:  Negative for confusion.   All other systems reviewed and are negative.    Physical Exam Triage Vital Signs ED Triage Vitals  Encounter Vitals Group     BP      Girls Systolic BP Percentile      Girls Diastolic BP Percentile      Boys Systolic BP Percentile      Boys Diastolic BP Percentile      Pulse      Resp      Temp      Temp src      SpO2      Weight      Height      Head Circumference      Peak Flow      Pain Score      Pain Loc      Pain Education      Exclude from Growth Chart    No data found.  Updated Vital Signs BP 128/79 (BP  Location: Right Arm)   Pulse 83   Temp 98.4 F (36.9 C) (Oral)   Resp 16   SpO2 98%   Visual Acuity Right Eye Distance:   Left Eye Distance:   Bilateral Distance:    Right Eye Near:   Left Eye Near:    Bilateral Near:     Physical Exam Vitals reviewed.  Constitutional:      General: She is not in acute distress.    Appearance: Normal appearance. She is not ill-appearing.  HENT:     Head: Normocephalic and atraumatic.     Right Ear: Tympanic membrane, ear canal and external ear normal. No tenderness. No middle ear effusion. There is no impacted cerumen. Tympanic membrane is not perforated, erythematous, retracted or bulging.     Left Ear: Tympanic membrane, ear canal and external ear normal. No tenderness.  No middle ear effusion. There is no impacted cerumen. Tympanic membrane is not perforated, erythematous, retracted or bulging.     Nose: Nose normal. No congestion.     Mouth/Throat:     Mouth: Mucous membranes are moist.     Pharynx: Uvula midline. No oropharyngeal exudate or posterior oropharyngeal erythema.     Tonsils: No tonsillar exudate.  Eyes:     Extraocular Movements: Extraocular movements intact.     Pupils: Pupils are equal, round, and reactive to light.  Cardiovascular:     Rate and Rhythm: Normal rate and regular rhythm.     Heart sounds: Normal heart sounds.  Pulmonary:     Effort: Pulmonary effort is normal.     Breath sounds: Normal breath sounds. No decreased breath sounds, wheezing, rhonchi or rales.  Abdominal:     Palpations: Abdomen is soft.     Tenderness: There is no abdominal tenderness. There is no guarding or rebound.  Lymphadenopathy:     Cervical: No cervical adenopathy.     Right cervical: No superficial, deep or posterior cervical adenopathy.    Left cervical: No superficial, deep or posterior cervical adenopathy.  Skin:    Comments: No rash   Neurological:     General: No focal deficit present.     Mental Status: She is alert and  oriented to person, place, and time.  Psychiatric:  Mood and Affect: Mood normal.        Behavior: Behavior normal.        Thought Content: Thought content normal.        Judgment: Judgment normal.      UC Treatments / Results  Labs (all labs ordered are listed, but only abnormal results are displayed) Labs Reviewed - No data to display  EKG   Radiology No results found.  Procedures Procedures (including critical care time)  Medications Ordered in UC Medications - No data to display  Initial Impression / Assessment and Plan / UC Course  I have reviewed the triage vital signs and the nursing notes.  Pertinent labs & imaging results that were available during my care of the patient were reviewed by me and considered in my medical decision making (see chart for details).     Patient is a pleasant 58 y.o. female presenting with flu-like illness. The patient is afebrile and nontachycardic.  Antipyretic has not been administered today.  Appears to be progressing through course of viral illness normally.  Did not check a COVID or influenza test due to duration of symptoms.  Continue Tylenol for myalgias and chills.  Add ibuprofen  for additional relief.  Good hydration, rest.  Level 3 based on time: Time before visit: 4 minutes During visit: 12 minutes Time after visit: 5 minutes   Final Clinical Impressions(s) / UC Diagnoses   Final diagnoses:  Influenza-like illness     Discharge Instructions      -You have a virus, like the common cold.  Viruses typically last 5 to 7 days.  After 7 days, your symptoms should be improving rather than worsening.  If your symptoms improve, and then worsen again, this is when we worry about a sinus infection or a lung infection, and you should return for additional care. -For fevers/chills, bodyaches, headaches: you can take Tylenol up to 1000 mg 3 times daily, and ibuprofen  up to 600 mg 3 times daily with food.  You can take  these together, or alternate every 3-4 hours.       ED Prescriptions   None    PDMP not reviewed this encounter.     [1]  Social History Tobacco Use   Smoking status: Never   Smokeless tobacco: Never  Vaping Use   Vaping status: Never Used  Substance Use Topics   Alcohol use: No   Drug use: No     Arlyss Leita BRAVO, PA-C 04/29/24 1352  "

## 2024-04-29 NOTE — ED Triage Notes (Addendum)
 Pt c/o body aches and (L) neck pain since Christmas. Pt reports fever yesterday and today, no temp taken but pt reports chills. Patient took Tylenol around 7 am. Pt denies cough, sore throat, nausea, vomiting, or diarrhea. Pt denies any known exposures, she did attend work Christmas party on 12/24. NAD. Steady gait.

## 2024-04-29 NOTE — Discharge Instructions (Addendum)
-  You have a virus, like the common cold.  Viruses typically last 5 to 7 days.  After 7 days, your symptoms should be improving rather than worsening.  If your symptoms improve, and then worsen again, this is when we worry about a sinus infection or a lung infection, and you should return for additional care. -For fevers/chills, bodyaches, headaches: you can take Tylenol up to 1000 mg 3 times daily, and ibuprofen  up to 600 mg 3 times daily with food.  You can take these together, or alternate every 3-4 hours.

## 2024-05-29 ENCOUNTER — Encounter: Payer: Self-pay | Admitting: Obstetrics and Gynecology

## 2024-05-29 ENCOUNTER — Ambulatory Visit: Payer: PRIVATE HEALTH INSURANCE | Admitting: Obstetrics and Gynecology

## 2024-05-29 VITALS — BP 102/67 | HR 82

## 2024-05-29 DIAGNOSIS — Z960 Presence of urogenital implants: Secondary | ICD-10-CM | POA: Diagnosis not present

## 2024-05-29 DIAGNOSIS — N812 Incomplete uterovaginal prolapse: Secondary | ICD-10-CM

## 2024-05-29 DIAGNOSIS — N813 Complete uterovaginal prolapse: Secondary | ICD-10-CM | POA: Diagnosis not present

## 2024-05-29 NOTE — Progress Notes (Signed)
 [facility]   Subjective:     Chief Complaint:  Chief Complaint  Patient presents with   Follow-up    Veronica Coleman is a 59 y.o. female is here for pessary check    History of Present Illness: Veronica Coleman is a 59 y.o. female with stage III pelvic organ prolapse who presents for a pessary check. She is using a size #4 short stem gellhorn pessary. The pessary has been working well. She has noticed some pressure in her right buttock but not sure if it is related. She is not using the estrogen cream. She started the metrogel  last visit and was not sure to restart the estrogen cream. She is not having any vaginal bleeding.   Past Medical History: Patient  has a past medical history of Cystocele with prolapse, Environmental allergies, and Hypertension.   Past Surgical History: She  has a past surgical history that includes Colonoscopy.   Medications: She has a current medication list which includes the following prescription(s): amlodipine , estradiol , and metronidazole .   Allergies: Patient has no known allergies.   Social History: Patient  reports that she has never smoked. She has never used smokeless tobacco. She reports that she does not drink alcohol and does not use drugs.      Objective:    Physical Exam: BP 102/67   Pulse 82  Gen: No apparent distress, A&O x 3. Detailed Urogynecologic Evaluation:  Pelvic Exam: Normal external female genitalia; Bartholin's and Skene's glands normal in appearance; urethral meatus normal in appearance, no urethral masses.  The pessary was noted to be in place. It was removed and cleaned. Speculum exam revealed no lesions in the vagina. The pessary was replaced. It was comfortable to the patient and fit well.    Assessment/Plan:    Assessment: Veronica Coleman is a 59 y.o. with stage III pelvic organ prolapse here for a pessary check. She is doing well.  Plan: - She will keep the pessary in place until next visit. She will  restart estrogen twice weekly.  - She is possibly interested in surgery. We discussed two options for prolapse repair:  1) vaginal repair without mesh - Pros - safer, no mesh complications - Cons - not as strong as mesh repair, higher risk of recurrence  2) laparoscopic repair with mesh - Pros - stronger, better long-term success - Cons - risks of mesh implant (erosion into vagina or bladder, adhering to the rectum, pain) - these risks are lower than with a vaginal mesh but still exist - Since her cervix was +3 on POP-Q we discussed better success with sacrocolpopexy. She is not sure she wants a hysterectomy. We reviewed the option of native tissue hysteropexy vs sacrohysteropexy (which I do not perform). Handouts provided on all her options for her to review.  - She will keep her 3 month pessary check for now and then will decide if she wants to move forward with surgery. She will need simple CMG/ UDS if she wants to proceed.   Veronica LOISE Caper, MD  Time spent: I spent 30 minutes dedicated to the care of this patient on the date of this encounter to include pre-visit review of records, face-to-face time with the patient and post visit documentation.

## 2024-08-28 ENCOUNTER — Ambulatory Visit: Payer: PRIVATE HEALTH INSURANCE | Admitting: Obstetrics and Gynecology
